# Patient Record
Sex: Male | Born: 1995 | Race: White | Hispanic: No | Marital: Single | Smoking: Current every day smoker
Health system: Southern US, Community
[De-identification: ages and names within clinical notes are randomized; demographics above are authoritative.]

## PROBLEM LIST (undated history)

## (undated) DIAGNOSIS — L858 Other specified epidermal thickening: Secondary | ICD-10-CM

## (undated) DIAGNOSIS — J40 Bronchitis, not specified as acute or chronic: Secondary | ICD-10-CM

## (undated) DIAGNOSIS — L209 Atopic dermatitis, unspecified: Secondary | ICD-10-CM

## (undated) DIAGNOSIS — T7840XA Allergy, unspecified, initial encounter: Secondary | ICD-10-CM

## (undated) HISTORY — DX: Other specified epidermal thickening: L85.8

## (undated) HISTORY — PX: OTHER SURGICAL HISTORY: SHX169

## (undated) HISTORY — PX: WISDOM TOOTH EXTRACTION: SHX21

## (undated) HISTORY — DX: Atopic dermatitis, unspecified: L20.9

## (undated) HISTORY — DX: Allergy, unspecified, initial encounter: T78.40XA

## (undated) HISTORY — PX: CIRCUMCISION: SUR203

## (undated) HISTORY — PX: DENTAL SURGERY: SHX609

---
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
## 2013-02-26 ENCOUNTER — Ambulatory Visit (INDEPENDENT_AMBULATORY_CARE_PROVIDER_SITE_OTHER): Payer: 59 | Admitting: Family Medicine

## 2013-02-26 ENCOUNTER — Encounter: Payer: Self-pay | Admitting: Family Medicine

## 2013-02-26 VITALS — BP 121/71 | HR 69 | Ht 75.0 in | Wt 169.0 lb

## 2013-02-26 DIAGNOSIS — K625 Hemorrhage of anus and rectum: Secondary | ICD-10-CM

## 2013-02-26 NOTE — Progress Notes (Signed)
CC: Shannon Barber is a 17 y.o. male is here for Establish Care and has Dietary question   Subjective: HPI:  Patient presents to reestablish after not being seen for 3 years by our clinic  Patient complains of rectal pain and bleeding that occurred 1-1/2 years ago. He reports never eating meat until one day eating at [employer] later that night he had bright red blood from his rectum with bowel movements that was described as moderate severity that quickly became mild and slowly improved over the next 3-5 days. He was accompanied by mild rectal irritation not further described. Since then he denies any bleeding tar-like stool, rectal pain, constipation, diarrhea, pelvic pain, unintentional weight loss. Nor abdominal pain. He denies family history of colon cancer, inflammatory bowel disease, or rectal bleeding. He is about to join the service and was to make sure he can eat red meat. He is able to eat fish and seafood without difficulty   Review of Systems - General ROS: negative for - chills, fever, night sweats, weight gain or weight loss Ophthalmic ROS: negative for - decreased vision Psychological ROS: negative for - anxiety or depression ENT ROS: negative for - hearing change, nasal congestion, tinnitus or allergies Hematological and Lymphatic ROS: negative for - bleeding problems, bruising or swollen lymph nodes Breast ROS: negative Respiratory ROS: no cough, shortness of breath, or wheezing Cardiovascular ROS: no chest pain or dyspnea on exertion Gastrointestinal ROS: no abdominal pain, change in bowel habits Genito-Urinary ROS: negative for - genital discharge, genital ulcers, incontinence or abnormal bleeding from genitals Musculoskeletal ROS: negative for - joint pain or muscle pain Neurological ROS: negative for - headaches or memory loss Dermatological ROS: negative for lumps, mole changes, rash and skin lesion changes  Past Medical History  Diagnosis Date  . Allergy    allergic rhinitis   . Atopic dermatitis   . Keratosis pilaris 2006    normal     Family History  Problem Relation Age of Onset  . Allergies Father     foods, atopic dermatitis  . Hypertension Maternal Grandmother   . Diabetes Maternal Grandfather   . Heart disease Maternal Grandfather   . Cancer Maternal Grandfather   . Heart disease Other     CVA  . Allergies       History  Substance Use Topics  . Smoking status: Light Tobacco Smoker  . Smokeless tobacco: Not on file  . Alcohol Use: No     Objective: Filed Vitals:   02/26/13 1330  BP: 121/71  Pulse: 69    Vital signs reviewed. General: Alert and Oriented, No Acute Distress HEENT: Pupils equal, round, reactive to light. Conjunctivae clear.  External ears unremarkable.  Moist mucous membranes. Lungs: Clear and comfortable work of breathing, speaking in full sentences without accessory muscle use. Cardiac: Regular rate and rhythm.  Neuro: CN II-XII grossly intact, gait normal. Extremities: No peripheral edema.  Strong peripheral pulses.  Mental Status: No depression, anxiety, nor agitation. Logical though process. Skin: Warm and dry. Abdomen: Normal bowel sounds, soft and non tender without palpable masses  Assessment & Plan: Shannon Barber was seen today for establish care and has dietary question.  Diagnoses and associated orders for this visit:  Rectal bleeding    Discussed with patient I think is probably safe for him to restart eating red meat however we will do a controlled challenge with 3 stool cards, one prior to eating a small load of red meat, one the morning after  and again to mornings after. If at all positive on either card we'll refer for colonoscopy.  Return if symptoms worsen or fail to improve.

## 2013-02-26 NOTE — Patient Instructions (Addendum)
Ground Beef Challenge - One unseasoned meatball cooked to 160F.  -First stool card: Stool from day before eating meat  -Second stool card: Stool from morning after eating meat.  -Third stool card: Stool from second morning after eating meat.

## 2013-03-12 ENCOUNTER — Telehealth: Payer: Self-pay | Admitting: Family Medicine

## 2013-03-12 NOTE — Telephone Encounter (Signed)
Note found on my desk requesting I call Garo at [phone]. Left message.  Sue Lush,  Will you please try calling Ingvald either today or tomorrow to see if he has any specific questions.

## 2013-03-13 LAB — POC HEMOCCULT BLD/STL (HOME/3-CARD/SCREEN)
Card #1 Date: NEGATIVE
Card #3 Date: NEGATIVE

## 2013-03-13 LAB — CHG BLOOD OCCULT,BY PEROXID,FECES,SINGLE, COLORECTAL SCREEN: .: NEGATIVE

## 2013-03-13 NOTE — Telephone Encounter (Signed)
Left complete message on vm with below results and advise

## 2013-03-13 NOTE — Telephone Encounter (Signed)
Sue Lush, Will you please let Lennix know that his stool cards were negative for blood.  I don't see any evidence of him having an allergy or intolerance to beef if he consumed beef prior to collecting the stool cards.

## 2013-03-13 NOTE — Addendum Note (Signed)
Addended by: Wyline Beady on: 03/13/2013 04:50 PM   Modules accepted: Orders

## 2013-03-13 NOTE — Addendum Note (Signed)
Addended by: Wyline Beady on: 03/13/2013 05:03 PM   Modules accepted: Orders

## 2013-11-01 ENCOUNTER — Ambulatory Visit (INDEPENDENT_AMBULATORY_CARE_PROVIDER_SITE_OTHER): Payer: 59 | Admitting: Family Medicine

## 2013-11-01 ENCOUNTER — Encounter: Payer: Self-pay | Admitting: Family Medicine

## 2013-11-01 VITALS — BP 127/84 | HR 90 | Temp 97.8°F | Wt 169.0 lb

## 2013-11-01 DIAGNOSIS — M25519 Pain in unspecified shoulder: Secondary | ICD-10-CM

## 2013-11-01 DIAGNOSIS — M25511 Pain in right shoulder: Secondary | ICD-10-CM

## 2013-11-01 NOTE — Progress Notes (Signed)
CC: Shannon Barber is a 18 y.o. male is here for right shoulder pain   Subjective: HPI:  Complains of right shoulder pain that has been present for the past 2-3 days that was not preceded by any particular event. 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 At that time pain was mild however progressively worsened to a moderate degree of the course of days. Unfortunately he was discharged from the [employer] because of this injury he believes he was formally diagnosed with an a.c. Sprain. Interventions in the past included x-rays which to his understanding were unremarkable. He has tried Aleve without any benefit. Pain is hard to localize but he estimates that it is in the posterior right shoulder nonradiating. It is worse when trying to lift heavy objects or when rolling over right shoulder nothing else particularly makes it better or worse. He denies motor or sensory disturbances in the right upper extremity nor neck pain. Denies overlying skin changes, cough, shortness of breath, nor chest pain   Review Of Systems Outlined In HPI  Past Medical History  Diagnosis Date  . Allergy     allergic rhinitis   . Atopic dermatitis   . Keratosis pilaris 2006    normal    Past Surgical History  Procedure Laterality Date  . Circumcision     Family History  Problem Relation Age of Onset  . Allergies Father     foods, atopic dermatitis  . Hypertension Maternal Grandmother   . Diabetes Maternal Grandfather   . Heart disease Maternal Grandfather   . Cancer Maternal Grandfather   . Heart disease Other     CVA  . Allergies      History   Social History  . Marital Status: Single    Spouse Name: N/A    Number of Children: N/A  . Years of Education: N/A   Occupational History  . Not on file.   Social History Main Topics  . Smoking status: Light Tobacco Smoker  . Smokeless tobacco: Not on  file  . Alcohol Use: No  . Drug Use: No  . Sexual Activity: Not Currently     Comment: lives with parents, home schooled, enjoys baseball, planes, wants to be a pilot, no smoking in home, no pets, follows lactose-free diet, vegetarian diet, takes B 12 daily.   Other Topics Concern  . Not on file   Social History Narrative  . No narrative on file     Objective: BP 127/84  Pulse 90  Temp(Src) 97.8 F (36.6 C) (Oral)  Wt 169 lb (76.658 kg)  General: Alert and Oriented, No Acute Distress HEENT: Pupils equal, round, reactive to light. Conjunctivae clear.  Moist mucous membranes pharynx unremarkable Lungs: Clear and comfortable work of breathing Cardiac: Regular rate and rhythm.  Right shoulder exam reveals full range of motion and strength in all planes of motion and with individual rotator cuff testing. No overlying redness warmth or swelling.  Neer's test negative.  Hawkins test positive however pain localized to posterior shoulder. Empty can negative. Crossarm test negative. O'Brien's test negative. Apprehension test negative. Speed's test negative. With his shirt off while pushing off the wall there is no evidence of scapular winging. When abducting his right arm 180 in a arc the right scapula appears to externally rotate dramatically greater than compared to the left and there is crepitus overlying the  rotating scapula  Mental Status: No depression, anxiety, nor agitation. Skin: Warm and dry.  Assessment & Plan: Shannon Barber was seen today for right shoulder pain.  Diagnoses and associated orders for this visit:  Right shoulder pain    Discussed with patient low suspicion for rotator cuff or intra-articular pathology/source of pain. 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 Joint dysfunction based on his diagnosis from the [employer]. I've encouraged him to consider  home exercise rehabilitation for scapular stabilization versus the  addition of formal physical therapy which would certainly help speed recovery period. he needs to Discuss this with his mother since she is in charge of insurance.  Return if symptoms worsen or fail to improve.

## 2014-01-29 ENCOUNTER — Telehealth: Payer: Self-pay | Admitting: *Deleted

## 2014-01-29 DIAGNOSIS — M25511 Pain in right shoulder: Secondary | ICD-10-CM

## 2014-01-29 NOTE — Telephone Encounter (Signed)
Referral has been placed. 

## 2014-01-29 NOTE — Telephone Encounter (Signed)
Pt notified via vm

## 2014-01-29 NOTE — Telephone Encounter (Signed)
Pt would like to do PT for his shoulder.

## 2014-02-05 ENCOUNTER — Telehealth: Payer: Self-pay | Admitting: *Deleted

## 2014-02-05 NOTE — Telephone Encounter (Signed)
Pt's mother called and left a vm on my phone upset that pt has not been scheduled for PT yet. She also states on the vm not to give information to her husband who works third shift and doesn't have the time or availability to relay inforamtion. Since this pt is over 16 I didn't call the mother back. I called the number in the patient's chart listed as the mobile # and left a vm stating that a referral had been place on 6/2. Some one from PT actually had left a vm for the pt to call their office to schedule with them. On the message I left a left the number [phone]) so pt could call to schedule with them

## 2014-02-14 ENCOUNTER — Ambulatory Visit: Payer: 59 | Admitting: Physical Therapy

## 2014-02-22 ENCOUNTER — Ambulatory Visit (INDEPENDENT_AMBULATORY_CARE_PROVIDER_SITE_OTHER): Payer: 59 | Admitting: Physical Therapy

## 2014-02-22 DIAGNOSIS — M6281 Muscle weakness (generalized): Secondary | ICD-10-CM

## 2014-02-22 DIAGNOSIS — R609 Edema, unspecified: Secondary | ICD-10-CM

## 2014-02-22 DIAGNOSIS — M25619 Stiffness of unspecified shoulder, not elsewhere classified: Secondary | ICD-10-CM

## 2014-02-25 ENCOUNTER — Encounter: Payer: 59 | Admitting: Physical Therapy

## 2014-03-05 ENCOUNTER — Encounter: Payer: 59 | Admitting: Physical Therapy

## 2014-03-07 ENCOUNTER — Encounter (INDEPENDENT_AMBULATORY_CARE_PROVIDER_SITE_OTHER): Payer: 59 | Admitting: Physical Therapy

## 2014-03-07 DIAGNOSIS — M25619 Stiffness of unspecified shoulder, not elsewhere classified: Secondary | ICD-10-CM

## 2014-03-07 DIAGNOSIS — R609 Edema, unspecified: Secondary | ICD-10-CM

## 2014-03-07 DIAGNOSIS — M25519 Pain in unspecified shoulder: Secondary | ICD-10-CM

## 2014-03-07 DIAGNOSIS — M6281 Muscle weakness (generalized): Secondary | ICD-10-CM

## 2014-03-14 ENCOUNTER — Encounter: Payer: 59 | Admitting: Physical Therapy

## 2014-04-07 ENCOUNTER — Emergency Department
Admission: EM | Admit: 2014-04-07 | Discharge: 2014-04-07 | Disposition: A | Discharge status: Home / Self Care | Payer: 59 | Source: Home / Self Care | Attending: Emergency Medicine | Admitting: Emergency Medicine

## 2014-04-07 ENCOUNTER — Encounter: Payer: Self-pay | Admitting: Emergency Medicine

## 2014-04-07 DIAGNOSIS — J020 Streptococcal pharyngitis: Secondary | ICD-10-CM

## 2014-04-07 DIAGNOSIS — J0300 Acute streptococcal tonsillitis, unspecified: Secondary | ICD-10-CM

## 2014-04-07 LAB — POCT RAPID STREP A (OFFICE): Rapid Strep A Screen: POSITIVE — AB

## 2014-04-07 MED ORDER — PENICILLIN G BENZATHINE 1200000 UNIT/2ML IM SUSP
1.2000 10*6.[IU] | Freq: Once | INTRAMUSCULAR | Status: AC
  Administered 2014-04-07: 1.2 10*6.[IU] via INTRAMUSCULAR

## 2014-04-07 MED ORDER — AMOXICILLIN 875 MG PO TABS: ORAL_TABLET | ORAL | Status: DC

## 2014-04-07 NOTE — ED Provider Notes (Addendum)
CSN: [id number]     Arrival date & time 04/07/14  1105 History   First MD Initiated Contact with Patient 04/07/14 1117     Chief Complaint  Patient presents with  . Sore Throat    HPI SORE THROAT Onset:3 days    Severity: Severe Tried OTC meds without significant relief.  Symptoms:  + Fever , chills and sweats + Swollen neck glands No Recent Strep Exposure     No Myalgias No Headache No Rash  No Discolored Nasal Mucus No Allergy symptoms No sinus pain/pressure No itchy/red eyes No earache  No Drooling No Trismus  Mild Nausea, with decreased appetite. Tolerating small amount of clear liquids. No Vomiting No Abdominal pain No Diarrhea No Reflux symptoms  Mild Cough No Breathing Difficulty No Shortness of Breath No pleuritic pain No Wheezing No Hemoptysis   Past Medical History  Diagnosis Date  . Allergy     allergic rhinitis   . Atopic dermatitis   . Keratosis pilaris 2006    normal   Past Surgical History  Procedure Laterality Date  . Circumcision    . Wisdom tooth extraction    . Dental surgery     Family History  Problem Relation Age of Onset  . Allergies Father     foods, atopic dermatitis  . Hypertension Maternal Grandmother   . Diabetes Maternal Grandfather   . Heart disease Maternal Grandfather   . Cancer Maternal Grandfather   . Heart disease Other     CVA  . Allergies     History  Substance Use Topics  . Smoking status: Light Tobacco Smoker  . Smokeless tobacco: Never Used  . Alcohol Use: No    Review of Systems  All other systems reviewed and are negative.   Allergies  Review of patient's allergies indicates no known allergies.  Home Medications   Prior to Admission medications   Medication Sig Start Date End Date Taking? Authorizing Provider  amoxicillin (AMOXIL) 875 MG tablet Take 1 twice a day X 10 days. 04/07/14   Lajean Manesavid Massey, MD   BP 108/69  Pulse 87  Temp(Src) 98.2 F (36.8 C) (Oral)  Ht 6\' 3"  (1.905 m)  Wt  157 lb 4 oz (71.328 kg)  BMI 19.65 kg/m2  SpO2 97% Physical Exam  Nursing note and vitals reviewed. Constitutional: He is oriented to person, place, and time. He appears well-developed and well-nourished.  Non-toxic appearance. He appears ill. No distress.  HENT:  Head: Normocephalic and atraumatic.  Right Ear: Tympanic membrane, external ear and ear canal normal.  Left Ear: Tympanic membrane, external ear and ear canal normal.  Nose: Nose normal. Right sinus exhibits no maxillary sinus tenderness and no frontal sinus tenderness. Left sinus exhibits no maxillary sinus tenderness and no frontal sinus tenderness.  Mouth/Throat: Uvula is midline and mucous membranes are normal. No oral lesions. Posterior oropharyngeal erythema present. No oropharyngeal exudate or tonsillar abscesses.  2+ bilateral Tonsillar enlargement. Beefy-red posterior pharynx and tonsils without exudate  Airway intact.  Eyes: Conjunctivae are normal. No scleral icterus.  Neck: Neck supple.  Cardiovascular: Normal rate, regular rhythm and normal heart sounds.   No murmur heard. Pulmonary/Chest: Effort normal and breath sounds normal. No stridor. No respiratory distress. He has no wheezes. He has no rhonchi. He has no rales.  Abdominal: Soft. He exhibits no mass. There is no hepatosplenomegaly. There is no tenderness.  Lymphadenopathy:    He has cervical adenopathy.       Right cervical: Superficial  cervical adenopathy present. No deep cervical and no posterior cervical adenopathy present.      Left cervical: Superficial cervical adenopathy present. No deep cervical and no posterior cervical adenopathy present.  Neurological: He is alert and oriented to person, place, and time.  Skin: Skin is warm. No rash noted.  Psychiatric: He has a normal mood and affect.    ED Course  Procedures (including critical care time) Labs Review Labs Reviewed  POCT RAPID STREP A (OFFICE) - Abnormal; Notable for the following:     Rapid Strep A Screen Positive (*)    All other components within normal limits    Imaging Review No results found.   MDM   1. Acute streptococcal tonsillitis    Rapid strep test positive Treatment options discussed, as well as risks, benefits, alternatives. Patient and mother  voiced understanding and agreement with the following plans: Bicillin LA 1.2 million units IM stat. Amoxicillin 875 twice a day x10 days Hygiene and contagiousness discussed. Push fluids and other symptomatic care. Tylenol or ibuprofen as needed for pain or fever  Follow-up with your primary care doctor in 5-7 days if not improving, or sooner if symptoms become worse. Precautions discussed. Red flags discussed. Questions invited and answered. Patient and Mother voiced understanding and agreement.      Lajean Manes, MD 04/07/14 4540  Lajean Manes, MD 04/07/14 [phone]

## 2014-04-07 NOTE — ED Notes (Signed)
Sore throat began 3 days ago.  Productive cough with thin yellow sputum.   Hard to swallow and talk.

## 2014-04-11 ENCOUNTER — Telehealth: Payer: Self-pay

## 2014-04-11 NOTE — ED Notes (Signed)
Left message with his dad to have him call back to the urgent care.

## 2014-04-13 ENCOUNTER — Telehealth: Payer: Self-pay | Admitting: Emergency Medicine

## 2014-07-09 ENCOUNTER — Encounter: Payer: Self-pay | Admitting: Family Medicine

## 2014-07-09 ENCOUNTER — Other Ambulatory Visit: Payer: Self-pay | Admitting: Family Medicine

## 2014-07-09 ENCOUNTER — Ambulatory Visit (INDEPENDENT_AMBULATORY_CARE_PROVIDER_SITE_OTHER): Payer: 59 | Admitting: Family Medicine

## 2014-07-09 VITALS — BP 110/70 | HR 103 | Temp 98.6°F | Wt 164.0 lb

## 2014-07-09 DIAGNOSIS — R197 Diarrhea, unspecified: Secondary | ICD-10-CM

## 2014-07-09 DIAGNOSIS — Z206 Contact with and (suspected) exposure to human immunodeficiency virus [HIV]: Secondary | ICD-10-CM

## 2014-07-09 NOTE — Progress Notes (Signed)
CC: Shannon Barber is a 18 y.o. male is here for Diarrhea   Subjective: HPI:  Complaint of diarrhea present for the past2-3 weeks on a daily basis. He estimates he is defecating5-10 times a day. He describes it as loose and mostly pure liquid. It is brown but denies melena or any blood in stool. Symptoms have not gotten better or worsens onset and came on abruptly. Interventions have included Imodium right ear however this made symptoms worse. He's had decreased appetite but this began months before diarrhea. Nothing else next better or worse other than above. It does not awaken him at night. Nobody else at home has similar symptoms. He denies recent travel, drinking untreated water, nausea, vomiting, flank pain, nor abdominal pain.  Expresses concern that he had sexual relations with a individual that may have had HIV. He's no longer in contact with this individual. Intercourse occurred back in 2014. He has no specific complaints other than that above that are prompting his concern today about HIV.  He reports blood exposure to an individual that had herpes all he was trying to help a wound. Bleeding. He isconcerned thathe may have been exposed to herpes virus this way.   Review Of Systems Outlined In HPI  Past Medical History  Diagnosis Date  . Allergy     allergic rhinitis   . Atopic dermatitis   . Keratosis pilaris 2006    normal    Past Surgical History  Procedure Laterality Date  . Circumcision    . Wisdom tooth extraction    . Dental surgery     Family History  Problem Relation Age of Onset  . Allergies Father     foods, atopic dermatitis  . Hypertension Maternal Grandmother   . Diabetes Maternal Grandfather   . Heart disease Maternal Grandfather   . Cancer Maternal Grandfather   . Heart disease Other     CVA  . Allergies      History   Social History  . Marital Status: Single    Spouse Name: N/A    Number of Children: N/A  . Years of Education: N/A    Occupational History  . Not on file.   Social History Main Topics  . Smoking status: Light Tobacco Smoker  . Smokeless tobacco: Never Used  . Alcohol Use: No  . Drug Use: No  . Sexual Activity: Not Currently     Comment: lives with parents, home schooled, enjoys baseball, planes, wants to be a pilot, no smoking in home, no pets, follows lactose-free diet, vegetarian diet, takes B 12 daily.   Other Topics Concern  . Not on file   Social History Narrative     Objective: BP 110/70 mmHg  Pulse 103  Temp(Src) 98.6 F (37 C) (Oral)  Wt 164 lb (74.39 kg)  General: Alert and Oriented, No Acute Distress HEENT: Pupils equal, round, reactive to light. Conjunctivae clear.  Moistmucous membranes pharynxunremarkable Lungs: clearing comfortable work of breathing Cardiac: Regular rate and rhythm.  Abdomen: Normal bowel sounds, soft without palpable masses.mild left lower quadrant pain with deep palpation but no rebound or guarding or rigidity. No pain in the other 3 quadrants Extremities: No peripheral edema.  Strong peripheral pulses.  Mental Status: No depression, anxiety, nor agitation. Skin: Warm and dry.  Assessment & Plan: Shannon Barber was seen today for diarrhea.  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 Difficile is a possibility. Checking metabolic panel and white count with differential.ultimate plan will be based on the above results Reassured that he cannot contract herpes from his uncut hands being exposed to the blood of someone that may have had the herpes virus.   Return if symptoms worsen or fail to improve.

## 2014-07-10 ENCOUNTER — Telehealth: Payer: Self-pay | Admitting: Family Medicine

## 2014-07-10 LAB — HIV ANTIBODY (ROUTINE TESTING W REFLEX): HIV 1&2 Ab, 4th Generation: NONREACTIVE

## 2014-07-10 LAB — COMPLETE METABOLIC PANEL WITH GFR
ALT: 9 U/L (ref 0–53)
AST: 15 U/L (ref 0–37)
Albumin: 4.6 g/dL (ref 3.5–5.2)
Alkaline Phosphatase: 69 U/L (ref 39–117)
BILIRUBIN TOTAL: 0.6 mg/dL (ref 0.2–1.2)
BUN: 11 mg/dL (ref 6–23)
CO2: 32 meq/L (ref 19–32)
Calcium: 9.7 mg/dL (ref 8.4–10.5)
Chloride: 99 mEq/L (ref 96–112)
Creat: 0.84 mg/dL (ref 0.50–1.35)
GFR, Est African American: >89 mL/min
GLUCOSE: 80 mg/dL (ref 70–99)
Potassium: 3.8 mEq/L (ref 3.5–5.3)
SODIUM: 139 meq/L (ref 135–145)
Total Protein: 7.4 g/dL (ref 6.0–8.3)

## 2014-07-10 LAB — CBC WITH DIFFERENTIAL/PLATELET
BASOS PCT: 0 % (ref 0–1)
Basophils Absolute: 0 10*3/uL (ref 0.0–0.1)
EOS ABS: 0.5 10*3/uL (ref 0.0–0.7)
EOS PCT: 4 % (ref 0–5)
HEMATOCRIT: 46.1 % (ref 39.0–52.0)
Hemoglobin: 16.2 g/dL (ref 13.0–17.0)
LYMPHS ABS: 2.3 10*3/uL (ref 0.7–4.0)
Lymphocytes Relative: 19 % (ref 12–46)
MCH: 30.7 pg (ref 26.0–34.0)
MCHC: 35.1 g/dL (ref 30.0–36.0)
MCV: 87.5 fL (ref 78.0–100.0)
MONO ABS: 1.2 10*3/uL — AB (ref 0.1–1.0)
MONOS PCT: 10 % (ref 3–12)
NEUTROS PCT: 67 % (ref 43–77)
Neutro Abs: 8.1 10*3/uL — ABNORMAL HIGH (ref 1.7–7.7)
Platelets: 209 10*3/uL (ref 150–400)
RBC: 5.27 MIL/uL (ref 4.22–5.81)
RDW: 13.4 % (ref 11.5–15.5)
WBC: 12.1 10*3/uL — ABNORMAL HIGH (ref 4.0–10.5)

## 2014-07-10 NOTE — Telephone Encounter (Signed)
Patient called back and I relayed your message regarding HIV test being negative.  Also he is planning to do stool cards and bring them back this week or first of next week.  I just wanted to make a note for your reference.

## 2014-07-10 NOTE — Telephone Encounter (Signed)
Noted, i'm assuming this is in reference to a stool sample and not stool cards since no one in the back office gave him any of these or intended to give him these at his last visit

## 2014-07-12 ENCOUNTER — Emergency Department (HOSPITAL_BASED_OUTPATIENT_CLINIC_OR_DEPARTMENT_OTHER): Payer: 59

## 2014-07-12 ENCOUNTER — Emergency Department (HOSPITAL_BASED_OUTPATIENT_CLINIC_OR_DEPARTMENT_OTHER)
Admission: EM | Admit: 2014-07-12 | Discharge: 2014-07-12 | Disposition: A | Discharge status: Home / Self Care | Payer: 59 | Attending: Emergency Medicine | Admitting: Emergency Medicine

## 2014-07-12 ENCOUNTER — Encounter (HOSPITAL_BASED_OUTPATIENT_CLINIC_OR_DEPARTMENT_OTHER): Payer: Self-pay | Admitting: Emergency Medicine

## 2014-07-12 DIAGNOSIS — Y9389 Activity, other specified: Secondary | ICD-10-CM | POA: Diagnosis not present

## 2014-07-12 DIAGNOSIS — Z872 Personal history of diseases of the skin and subcutaneous tissue: Secondary | ICD-10-CM | POA: Diagnosis not present

## 2014-07-12 DIAGNOSIS — Y998 Other external cause status: Secondary | ICD-10-CM | POA: Diagnosis not present

## 2014-07-12 DIAGNOSIS — S6991XA Unspecified injury of right wrist, hand and finger(s), initial encounter: Secondary | ICD-10-CM | POA: Diagnosis present

## 2014-07-12 DIAGNOSIS — W228XXA Striking against or struck by other objects, initial encounter: Secondary | ICD-10-CM | POA: Insufficient documentation

## 2014-07-12 DIAGNOSIS — S62326A Displaced fracture of shaft of fifth metacarpal bone, right hand, initial encounter for closed fracture: Secondary | ICD-10-CM | POA: Insufficient documentation

## 2014-07-12 DIAGNOSIS — Z720 Tobacco use: Secondary | ICD-10-CM | POA: Diagnosis not present

## 2014-07-12 DIAGNOSIS — S62308A Unspecified fracture of other metacarpal bone, initial encounter for closed fracture: Secondary | ICD-10-CM

## 2014-07-12 DIAGNOSIS — Y929 Unspecified place or not applicable: Secondary | ICD-10-CM | POA: Diagnosis not present

## 2014-07-12 DIAGNOSIS — R52 Pain, unspecified: Secondary | ICD-10-CM

## 2014-07-12 MED ORDER — OXYCODONE-ACETAMINOPHEN 5-325 MG PO TABS
1.0000 | ORAL_TABLET | Freq: Once | ORAL | Status: DC
  Filled 2014-07-12: qty 1

## 2014-07-12 MED ORDER — KETOROLAC TROMETHAMINE 60 MG/2ML IM SOLN
60.0000 mg | Freq: Once | INTRAMUSCULAR | Status: AC
  Administered 2014-07-12: 60 mg via INTRAMUSCULAR
  Filled 2014-07-12: qty 2

## 2014-07-12 MED ORDER — OXYCODONE-ACETAMINOPHEN 5-325 MG PO TABS: 1.0000 | ORAL_TABLET | Freq: Four times a day (QID) | ORAL | Status: DC | PRN

## 2014-07-12 MED ORDER — IBUPROFEN 800 MG PO TABS: 800.0000 mg | ORAL_TABLET | Freq: Three times a day (TID) | ORAL | Status: DC

## 2014-07-12 NOTE — ED Provider Notes (Signed)
CSN: [id number]     Arrival date & time 07/12/14  [id number] History   First MD Initiated Contact with Patient 07/12/14 0407     Chief Complaint  Patient presents with  . Hand Injury     (Consider location/radiation/quality/duration/timing/severity/associated sxs/prior Treatment) Patient is a 18 y.o. male presenting with hand injury. The history is provided by the patient.  Hand Injury Location:  Hand Injury: yes   Mechanism of injury comment:  Punched wooden fence Hand location:  R hand Pain details:    Quality:  Aching   Radiates to:  Does not radiate   Severity:  Severe   Onset quality:  Sudden   Timing:  Constant   Progression:  Unchanged Chronicity:  New Dislocation: no   Foreign body present:  No foreign bodies Relieved by:  Nothing Worsened by:  Nothing tried Ineffective treatments:  None tried Associated symptoms: no back pain   Risk factors: no concern for non-accidental trauma     Past Medical History  Diagnosis Date  . Allergy     allergic rhinitis   . Atopic dermatitis   . Keratosis pilaris 2006    normal   Past Surgical History  Procedure Laterality Date  . Circumcision    . Wisdom tooth extraction    . Dental surgery     Family History  Problem Relation Age of Onset  . Allergies Father     foods, atopic dermatitis  . Hypertension Maternal Grandmother   . Diabetes Maternal Grandfather   . Heart disease Maternal Grandfather   . Cancer Maternal Grandfather   . Heart disease Other     CVA  . Allergies     History  Substance Use Topics  . Smoking status: Light Tobacco Smoker  . Smokeless tobacco: Never Used  . Alcohol Use: No    Review of Systems  Musculoskeletal: Negative for back pain.  All other systems reviewed and are negative.     Allergies  Review of patient's allergies indicates no known allergies.  Home Medications   Prior to Admission medications   Not on File   BP 116/91 mmHg  Pulse 120  Temp(Src) 98.3 F (36.8 C)  (Oral)  Resp 18  Ht 6\' 3"  (1.905 m)  Wt 150 lb (68.04 kg)  BMI 18.75 kg/m2  SpO2 98% Physical Exam  Constitutional: He is oriented to person, place, and time. He appears well-developed and well-nourished. No distress.  HENT:  Head: Normocephalic and atraumatic.  Eyes: Conjunctivae and EOM are normal.  Neck: Normal range of motion. Neck supple.  Cardiovascular: Normal rate, regular rhythm and intact distal pulses.   Pulmonary/Chest: Effort normal and breath sounds normal. He has no wheezes.  Abdominal: Soft. Bowel sounds are normal. There is no tenderness.  Musculoskeletal: Normal range of motion.       Right wrist: Normal. He exhibits normal range of motion, no tenderness, no bony tenderness, no swelling, no effusion, no crepitus, no deformity and no laceration.       Right hand: He exhibits deformity and swelling. He exhibits normal capillary refill and no laceration. Normal sensation noted. Normal strength noted.       Hands: Neurological: He is alert and oriented to person, place, and time.  Skin: Skin is warm and dry.  Psychiatric: He has a normal mood and affect.    ED Course  Procedures (including critical care time) Labs Review Labs Reviewed - No data to display  Imaging Review Dg Hand Complete Right  07/12/2014  CLINICAL DATA:  The patient punched a fence tonight. Right hand pain. Initial encounter.  EXAM: RIGHT HAND - COMPLETE 3+ VIEW  COMPARISON:  None.  FINDINGS: There is a fracture of the distal diaphysis of the fifth metacarpal with mild radial and volar angulation. No other bony or joint abnormality is identified.  IMPRESSION: Acute distal fifth metacarpal fracture.   Electronically Signed   By: Drusilla Kannerhomas  Dalessio M.D.   On: 07/12/2014 04:08     EKG Interpretation None      MDM   Final diagnoses:  Pain    Splint, ice elevation and follow up with hand surgery.  Patient refuses percocet as wants to drive to school at 8 am.  Will give RX.  No driving on  narcotics.  Patient verbalizes understanding and agrees to follow up    Kevis Qu Smitty CordsK Ronav Furney-Rasch, MD 07/12/14 0430

## 2014-07-12 NOTE — ED Notes (Signed)
Patient transported to X-ray 

## 2014-07-12 NOTE — ED Notes (Signed)
Hit fence w rt hand  Increased swelling and pain

## 2014-07-12 NOTE — ED Notes (Signed)
Pt punched [place]od[place] fence with hand about an hour ago

## 2014-07-16 ENCOUNTER — Other Ambulatory Visit: Payer: Self-pay | Admitting: Orthopedic Surgery

## 2014-07-17 ENCOUNTER — Encounter (HOSPITAL_BASED_OUTPATIENT_CLINIC_OR_DEPARTMENT_OTHER): Payer: Self-pay | Admitting: *Deleted

## 2014-07-17 ENCOUNTER — Telehealth: Payer: Self-pay | Admitting: Family Medicine

## 2014-07-17 LAB — C. DIFFICILE GDH AND TOXIN A/B
C. DIFFICILE GDH: NOT DETECTED
C. difficile Toxin A/B: NOT DETECTED

## 2014-07-17 MED ORDER — DIPHENOXYLATE-ATROPINE 2.5-0.025 MG PO TABS: 1.0000 | ORAL_TABLET | Freq: Four times a day (QID) | ORAL | Status: DC | PRN

## 2014-07-17 NOTE — Telephone Encounter (Signed)
Left message on vm with results  

## 2014-07-17 NOTE — Telephone Encounter (Signed)
Sue Lushndrea, Will you please let patient know that his stool sample did not show any signs of c.diff causing his diarrhea.  If he's still experiencing loose stools I've printed off an Rx of lomotil that he can use on an as needed basis. (in you inbox)

## 2014-07-22 ENCOUNTER — Encounter (HOSPITAL_BASED_OUTPATIENT_CLINIC_OR_DEPARTMENT_OTHER): Payer: Self-pay

## 2014-07-22 ENCOUNTER — Encounter (HOSPITAL_BASED_OUTPATIENT_CLINIC_OR_DEPARTMENT_OTHER)
Admission: RE | Disposition: A | Discharge status: Home / Self Care | Payer: Self-pay | Source: Ambulatory Visit | Attending: Orthopedic Surgery

## 2014-07-22 ENCOUNTER — Ambulatory Visit (HOSPITAL_BASED_OUTPATIENT_CLINIC_OR_DEPARTMENT_OTHER): Payer: 59 | Admitting: Anesthesiology

## 2014-07-22 ENCOUNTER — Ambulatory Visit (HOSPITAL_BASED_OUTPATIENT_CLINIC_OR_DEPARTMENT_OTHER)
Admission: RE | Admit: 2014-07-22 | Discharge: 2014-07-22 | Disposition: A | Discharge status: Home / Self Care | Payer: 59 | Source: Ambulatory Visit | Attending: Orthopedic Surgery | Admitting: Orthopedic Surgery

## 2014-07-22 DIAGNOSIS — S62326A Displaced fracture of shaft of fifth metacarpal bone, right hand, initial encounter for closed fracture: Secondary | ICD-10-CM | POA: Insufficient documentation

## 2014-07-22 DIAGNOSIS — M79641 Pain in right hand: Secondary | ICD-10-CM | POA: Diagnosis present

## 2014-07-22 DIAGNOSIS — F17210 Nicotine dependence, cigarettes, uncomplicated: Secondary | ICD-10-CM | POA: Insufficient documentation

## 2014-07-22 DIAGNOSIS — Y929 Unspecified place or not applicable: Secondary | ICD-10-CM | POA: Insufficient documentation

## 2014-07-22 DIAGNOSIS — X58XXXA Exposure to other specified factors, initial encounter: Secondary | ICD-10-CM | POA: Diagnosis not present

## 2014-07-22 DIAGNOSIS — K529 Noninfective gastroenteritis and colitis, unspecified: Secondary | ICD-10-CM | POA: Insufficient documentation

## 2014-07-22 DIAGNOSIS — F1210 Cannabis abuse, uncomplicated: Secondary | ICD-10-CM | POA: Diagnosis not present

## 2014-07-22 HISTORY — PX: OPEN REDUCTION INTERNAL FIXATION (ORIF) METACARPAL: SHX6234

## 2014-07-22 LAB — POCT HEMOGLOBIN-HEMACUE: Hemoglobin: 15.4 g/dL (ref 13.0–17.0)

## 2014-07-22 SURGERY — OPEN REDUCTION INTERNAL FIXATION (ORIF) METACARPAL
Anesthesia: General | Site: Finger | Laterality: Right

## 2014-07-22 MED ORDER — LIDOCAINE HCL (PF) 1 % IJ SOLN
INTRAMUSCULAR | Status: AC
  Filled 2014-07-22: qty 60

## 2014-07-22 MED ORDER — FENTANYL CITRATE 0.05 MG/ML IJ SOLN: 50.0000 ug | INTRAMUSCULAR | Status: DC | PRN

## 2014-07-22 MED ORDER — HYDROMORPHONE HCL 1 MG/ML IJ SOLN
0.2500 mg | INTRAMUSCULAR | Status: DC | PRN
  Administered 2014-07-22 (×2): 0.5 mg via INTRAVENOUS

## 2014-07-22 MED ORDER — MIDAZOLAM HCL 2 MG/2ML IJ SOLN
INTRAMUSCULAR | Status: AC
  Filled 2014-07-22: qty 2

## 2014-07-22 MED ORDER — FENTANYL CITRATE 0.05 MG/ML IJ SOLN
INTRAMUSCULAR | Status: DC | PRN
  Administered 2014-07-22 (×2): 50 ug via INTRAVENOUS

## 2014-07-22 MED ORDER — PROPOFOL 10 MG/ML IV BOLUS
INTRAVENOUS | Status: AC
  Filled 2014-07-22: qty 80

## 2014-07-22 MED ORDER — FENTANYL CITRATE 0.05 MG/ML IJ SOLN
INTRAMUSCULAR | Status: AC
  Filled 2014-07-22: qty 6

## 2014-07-22 MED ORDER — GLYCOPYRROLATE 0.2 MG/ML IJ SOLN
INTRAMUSCULAR | Status: DC | PRN
  Administered 2014-07-22: 0.2 mg via INTRAVENOUS

## 2014-07-22 MED ORDER — LACTATED RINGERS IV SOLN
INTRAVENOUS | Status: DC
  Administered 2014-07-22 (×2): via INTRAVENOUS

## 2014-07-22 MED ORDER — OXYCODONE-ACETAMINOPHEN 5-325 MG PO TABS: 1.0000 | ORAL_TABLET | ORAL | Status: DC | PRN

## 2014-07-22 MED ORDER — MIDAZOLAM HCL 2 MG/2ML IJ SOLN: 1.0000 mg | INTRAMUSCULAR | Status: DC | PRN

## 2014-07-22 MED ORDER — PROPOFOL 10 MG/ML IV BOLUS
INTRAVENOUS | Status: DC | PRN
  Administered 2014-07-22: 100 mg via INTRAVENOUS
  Administered 2014-07-22: 200 mg via INTRAVENOUS

## 2014-07-22 MED ORDER — ONDANSETRON HCL 4 MG/2ML IJ SOLN
INTRAMUSCULAR | Status: DC | PRN
  Administered 2014-07-22: 4 mg via INTRAVENOUS

## 2014-07-22 MED ORDER — PROPOFOL 10 MG/ML IV EMUL
INTRAVENOUS | Status: AC
  Filled 2014-07-22: qty 100

## 2014-07-22 MED ORDER — HYDROMORPHONE HCL 1 MG/ML IJ SOLN
INTRAMUSCULAR | Status: AC
  Filled 2014-07-22: qty 1

## 2014-07-22 MED ORDER — ONDANSETRON HCL 4 MG/2ML IJ SOLN: 4.0000 mg | Freq: Once | INTRAMUSCULAR | Status: DC | PRN

## 2014-07-22 MED ORDER — BUPIVACAINE HCL (PF) 0.25 % IJ SOLN
INTRAMUSCULAR | Status: AC
  Filled 2014-07-22: qty 30

## 2014-07-22 MED ORDER — OXYCODONE HCL 5 MG PO TABS
5.0000 mg | ORAL_TABLET | Freq: Once | ORAL | Status: AC | PRN
  Administered 2014-07-22: 5 mg via ORAL

## 2014-07-22 MED ORDER — OXYCODONE HCL 5 MG/5ML PO SOLN: 5.0000 mg | Freq: Once | ORAL | Status: AC | PRN

## 2014-07-22 MED ORDER — BUPIVACAINE HCL (PF) 0.25 % IJ SOLN
INTRAMUSCULAR | Status: DC | PRN
  Administered 2014-07-22: 10 mL

## 2014-07-22 MED ORDER — OXYCODONE HCL 5 MG PO TABS
ORAL_TABLET | ORAL | Status: AC
  Filled 2014-07-22: qty 1

## 2014-07-22 MED ORDER — MIDAZOLAM HCL 5 MG/5ML IJ SOLN
INTRAMUSCULAR | Status: DC | PRN
  Administered 2014-07-22: 2 mg via INTRAVENOUS

## 2014-07-22 MED ORDER — LIDOCAINE HCL (CARDIAC) 20 MG/ML IV SOLN
INTRAVENOUS | Status: DC | PRN
  Administered 2014-07-22: 50 mg via INTRAVENOUS

## 2014-07-22 MED ORDER — DEXAMETHASONE SODIUM PHOSPHATE 4 MG/ML IJ SOLN
INTRAMUSCULAR | Status: DC | PRN
  Administered 2014-07-22: 10 mg via INTRAVENOUS

## 2014-07-22 MED ORDER — CEFAZOLIN SODIUM-DEXTROSE 2-3 GM-% IV SOLR
INTRAVENOUS | Status: DC | PRN
  Administered 2014-07-22: 2 g via INTRAVENOUS

## 2014-07-22 SURGICAL SUPPLY — 62 items
APL SKNCLS STERI-STRIP NONHPOA (GAUZE/BANDAGES/DRESSINGS) ×1
BANDAGE ELASTIC 3 VELCRO ST LF (GAUZE/BANDAGES/DRESSINGS) ×2 IMPLANT
BANDAGE ELASTIC 4 VELCRO ST LF (GAUZE/BANDAGES/DRESSINGS) ×2 IMPLANT
BENZOIN TINCTURE PRP APPL 2/3 (GAUZE/BANDAGES/DRESSINGS) ×2 IMPLANT
BIT DRILL 1.1 MINI (BIT) ×1 IMPLANT
BLADE SURG 15 STRL LF DISP TIS (BLADE) ×1 IMPLANT
BLADE SURG 15 STRL SS (BLADE) ×2
BNDG CMPR 9X4 STRL LF SNTH (GAUZE/BANDAGES/DRESSINGS) ×1
BNDG CMPR MD 5X2 ELC HKLP STRL (GAUZE/BANDAGES/DRESSINGS)
BNDG ELASTIC 2 VLCR STRL LF (GAUZE/BANDAGES/DRESSINGS) IMPLANT
BNDG ESMARK 4X9 LF (GAUZE/BANDAGES/DRESSINGS) ×2 IMPLANT
BNDG GAUZE ELAST 4 BULKY (GAUZE/BANDAGES/DRESSINGS) ×2 IMPLANT
CANISTER SUCT 1200ML W/VALVE (MISCELLANEOUS) IMPLANT
CORDS BIPOLAR (ELECTRODE) ×2 IMPLANT
COVER BACK TABLE 60X90IN (DRAPES) ×2 IMPLANT
CUFF TOURNIQUET SINGLE 18IN (TOURNIQUET CUFF) ×2 IMPLANT
DECANTER SPIKE VIAL GLASS SM (MISCELLANEOUS) IMPLANT
DRAPE EXTREMITY T 121X128X90 (DRAPE) ×2 IMPLANT
DRAPE OEC MINIVIEW 54X84 (DRAPES) ×2 IMPLANT
DRAPE SURG 17X23 STRL (DRAPES) ×2 IMPLANT
DRILL BIT 1.1 MINI (BIT) ×2
DURAPREP 26ML APPLICATOR (WOUND CARE) ×2 IMPLANT
GAUZE SPONGE 4X4 12PLY STRL (GAUZE/BANDAGES/DRESSINGS) ×2 IMPLANT
GAUZE SPONGE 4X4 16PLY XRAY LF (GAUZE/BANDAGES/DRESSINGS) IMPLANT
GAUZE XEROFORM 1X8 LF (GAUZE/BANDAGES/DRESSINGS) IMPLANT
GLOVE SURG SYN 8.0 (GLOVE) ×2 IMPLANT
GOWN STRL REUS W/ TWL LRG LVL3 (GOWN DISPOSABLE) ×1 IMPLANT
GOWN STRL REUS W/TWL LRG LVL3 (GOWN DISPOSABLE) ×2
GOWN STRL REUS W/TWL XL LVL3 (GOWN DISPOSABLE) ×2 IMPLANT
NEEDLE HYPO 25X1 1.5 SAFETY (NEEDLE) IMPLANT
NS IRRIG 1000ML POUR BTL (IV SOLUTION) ×2 IMPLANT
PACK BASIN DAY SURGERY FS (CUSTOM PROCEDURE TRAY) ×2 IMPLANT
PAD CAST 3X4 CTTN HI CHSV (CAST SUPPLIES) ×1 IMPLANT
PAD CAST 4YDX4 CTTN HI CHSV (CAST SUPPLIES) IMPLANT
PADDING CAST ABS 4INX4YD NS (CAST SUPPLIES) ×1
PADDING CAST ABS COTTON 4X4 ST (CAST SUPPLIES) ×1 IMPLANT
PADDING CAST COTTON 3X4 STRL (CAST SUPPLIES) ×2
PADDING CAST COTTON 4X4 STRL (CAST SUPPLIES)
PADDING UNDERCAST 2 STRL (CAST SUPPLIES) ×1
PADDING UNDERCAST 2X4 STRL (CAST SUPPLIES) ×1 IMPLANT
PLATE STRAIGHT 1.5 6 HOLE (Plate) ×2 IMPLANT
SCREW CORTEX 1.5X10 (Screw) ×2 IMPLANT
SCREW CORTEX 1.5X8 (Screw) ×8 IMPLANT
SHEET MEDIUM DRAPE 40X70 STRL (DRAPES) ×2 IMPLANT
SPLINT PLASTER CAST XFAST 4X15 (CAST SUPPLIES) ×15 IMPLANT
SPLINT PLASTER XTRA FAST SET 4 (CAST SUPPLIES) ×15
STOCKINETTE 4X48 STRL (DRAPES) ×2 IMPLANT
STRIP CLOSURE SKIN 1/2X4 (GAUZE/BANDAGES/DRESSINGS) ×2 IMPLANT
SUCTION FRAZIER TIP 10 FR DISP (SUCTIONS) IMPLANT
SUT ETHILON 4 0 PS 2 18 (SUTURE) IMPLANT
SUT ETHILON 5 0 PS 2 18 (SUTURE) IMPLANT
SUT MERSILENE 4 0 P 3 (SUTURE) IMPLANT
SUT VIC AB 4-0 P-3 18XBRD (SUTURE) ×1 IMPLANT
SUT VIC AB 4-0 P3 18 (SUTURE) ×2
SUT VICRYL 4-0 PS2 18IN ABS (SUTURE) IMPLANT
SUT VICRYL RAPIDE 4-0 (SUTURE) IMPLANT
SUT VICRYL RAPIDE 4/0 PS 2 (SUTURE) IMPLANT
SYR BULB 3OZ (MISCELLANEOUS) ×2 IMPLANT
SYRINGE 10CC LL (SYRINGE) ×2 IMPLANT
TOWEL OR 17X24 6PK STRL BLUE (TOWEL DISPOSABLE) ×2 IMPLANT
TUBE CONNECTING 20X1/4 (TUBING) IMPLANT
UNDERPAD 30X30 INCONTINENT (UNDERPADS AND DIAPERS) ×2 IMPLANT

## 2014-07-22 NOTE — Discharge Instructions (Signed)

## 2014-07-22 NOTE — Transfer of Care (Signed)
Immediate Anesthesia Transfer of Care Note  Patient: Shannon Barber  Procedure(s) Performed: Procedure(s): OPEN REDUCTION INTERNAL FIXATION (ORIF) RIGHT SMALL  METACARPAL FRACTURE  (Right)  Patient Location: PACU  Anesthesia Type:General  Level of Consciousness: awake, alert  and patient cooperative  Airway & Oxygen Therapy: Patient Spontanous Breathing and Patient connected to face mask oxygen  Post-op Assessment: Report given to PACU RN and Post -op Vital signs reviewed and stable  Post vital signs: Reviewed and stable  Complications: No apparent anesthesia complications

## 2014-07-22 NOTE — Anesthesia Postprocedure Evaluation (Signed)
  Anesthesia Post-op Note  Patient: Shannon Barber  Procedure(s) Performed: Procedure(s): OPEN REDUCTION INTERNAL FIXATION (ORIF) RIGHT SMALL  METACARPAL FRACTURE  (Right)  Patient Location: PACU  Anesthesia Type:General  Level of Consciousness: awake, alert  and oriented  Airway and Oxygen Therapy: Patient Spontanous Breathing  Post-op Pain: none  Post-op Assessment: Post-op Vital signs reviewed  Post-op Vital Signs: Reviewed  Last Vitals:  Filed Vitals:   07/22/14 1017  BP: 115/69  Pulse: 65  Temp: 36.7 C  Resp: 18    Complications: No apparent anesthesia complications

## 2014-07-22 NOTE — Op Note (Signed)
See note [id number]

## 2014-07-22 NOTE — Anesthesia Procedure Notes (Signed)
Procedure Name: LMA Insertion Date/Time: 07/22/2014 8:14 AM Performed by: Genevieve NorlanderLINKA, Ida Milbrath L Pre-anesthesia Checklist: Patient identified, Emergency Drugs available, Suction available, Patient being monitored and Timeout performed Patient Re-evaluated:Patient Re-evaluated prior to inductionOxygen Delivery Method: Circle System Utilized Preoxygenation: Pre-oxygenation with 100% oxygen Intubation Type: IV induction Ventilation: Mask ventilation without difficulty LMA: LMA inserted LMA Size: 5.0 Number of attempts: 1 Airway Equipment and Method: bite block Placement Confirmation: positive ETCO2 Tube secured with: Tape Dental Injury: Teeth and Oropharynx as per pre-operative assessment

## 2014-07-22 NOTE — H&P (Signed)
Shannon Barber is an 18 y.o. male.   Chief Complaint: right hand pain and defomity HPI: as above s/p right hand trauma with displaced right small metacarpal fracture  History reviewed. No pertinent past medical history.  Past Surgical History  Procedure Laterality Date  . Oral gum graft surgery    . Wisdom tooth extraction      History reviewed. No pertinent family history. Social History:  reports that he has been smoking Cigarettes.  He has been smoking about 0.25 packs per day. He does not have any smokeless tobacco history on file. He reports that he drinks alcohol. He reports that he uses illicit drugs (Marijuana).  Allergies: No Known Allergies  Medications Prior to Admission  Medication Sig Dispense Refill  . ibuprofen (ADVIL,MOTRIN) 800 MG tablet Take 800 mg by mouth every 8 (eight) hours as needed.      No results found for this or any previous visit (from the past 48 hour(s)). No results found.  Review of Systems  All other systems reviewed and are negative.   Height 6\' 3"  (1.905 m), weight 70.308 kg (155 lb). Physical Exam  Constitutional: He is oriented to person, place, and time. He appears well-developed and well-nourished.  HENT:  Head: Normocephalic and atraumatic.  Cardiovascular: Normal rate.   Respiratory: Effort normal.  Musculoskeletal:       Right hand: He exhibits bony tenderness and deformity.  Displaced right small metacarpal fracture  Neurological: He is alert and oriented to person, place, and time.  Skin: Skin is warm.  Psychiatric: He has a normal mood and affect. His behavior is normal. Judgment and thought content normal.     Assessment/Plan As above   Plan ORIF  Boyd Buffalo A 07/22/2014, 6:15 AM

## 2014-07-22 NOTE — Anesthesia Preprocedure Evaluation (Addendum)
Anesthesia Evaluation  Patient identified by MRN, date of birth, ID band Patient awake    Reviewed: Allergy & Precautions, H&P , NPO status , Patient's Chart, lab work & pertinent test results  Airway Mallampati: I  TM Distance: >3 FB Neck ROM: Full    Dental  (+) Teeth Intact, Dental Advisory Given   Pulmonary Current Smoker,  breath sounds clear to auscultation        Cardiovascular negative cardio ROS  Rhythm:Regular Rate:Normal     Neuro/Psych negative neurological ROS  negative psych ROS   GI/Hepatic negative GI ROS, Neg liver ROS,   Endo/Other  negative endocrine ROS  Renal/GU negative Renal ROS     Musculoskeletal negative musculoskeletal ROS (+)   Abdominal   Peds  Hematology negative hematology ROS (+)   Anesthesia Other Findings Chronic diarrhea  Reproductive/Obstetrics                            Anesthesia Physical Anesthesia Plan  ASA: II  Anesthesia Plan: General   Post-op Pain Management:    Induction: Intravenous  Airway Management Planned: LMA  Additional Equipment:   Intra-op Plan:   Post-operative Plan: Extubation in OR  Informed Consent: I have reviewed the patients History and Physical, chart, labs and discussed the procedure including the risks, benefits and alternatives for the proposed anesthesia with the patient or authorized representative who has indicated his/her understanding and acceptance.   Dental advisory given  Plan Discussed with: CRNA and Surgeon  Anesthesia Plan Comments:        Anesthesia Quick Evaluation

## 2014-07-24 ENCOUNTER — Encounter (HOSPITAL_BASED_OUTPATIENT_CLINIC_OR_DEPARTMENT_OTHER): Payer: Self-pay | Admitting: Orthopedic Surgery

## 2014-07-24 NOTE — Op Note (Signed)
NAME:  Alcide CleverLACKEY, Jerid               ACCOUNT NO.:  [id number]  MEDICAL RECORD NO.:  [id number]  LOCATION:                                 FACILITY:  PHYSICIAN:  Artist PaisMatthew A. Tylasia Fletchall, M.D.DATE OF BIRTH:  [birth year]  DATE OF PROCEDURE:  07/22/2014 DATE OF DISCHARGE:  07/22/2014                              OPERATIVE REPORT   PREOPERATIVE DIAGNOSIS:  Displaced right small metacarpal fracture.  POSTOPERATIVE DIAGNOSIS:  Displaced right small metacarpal fracture.  PROCEDURE:  Open reduction and internal fixation of above using a 6-hole 1.5 mm Synthes modular hand set plate.  SURGEON:  Artist PaisMatthew A. Mina MarbleWeingold, M.D.  ASSISTANT:  None.  ANESTHESIA:  General.  COMPLICATIONS:  No complications.  DRAINS:  No drains.  DESCRIPTION OF PROCEDURE:  The patient was taken to the operating suite. After induction of general anesthetic, right upper extremity was prepped and draped in sterile fashion.  An Esmarch was used to exsanguinate the limb.  Tourniquet was then inflated to 250 mmHg.  At this point, incision made over the small metacarpal along the dorsal ulnar border. Skin was incised sharply.  The EDQ and EDC to the small finger was carefully retracted ulnarly.  A subperiosteal dissection of the fracture was undertaken.  We debrided the fracture of clot, reduction was performed with downward pressure, and side rotation as he had a malrotated fracture.  This was held with reduction clamp.  We then pre contoured a 6 hole 1.5 mm stainless steel 1.5 mm modular handset plate with 3 cortical screws above, 3 cortical screws below the fracture site. This was placed with no undue difficulty.  Intraoperative fluoroscopy revealed adequate reduction in AP, lateral, and oblique view.  The wound was irrigated and loosely closed in layers of 4-0 Vicryl to cover the plate and a 3-0 Prolene subcuticular stitch on the skin.  Steri-Strips, 4 x 4s, fluffs, and compressive dressing was applied.  The  patient tolerated the procedure well in concealed fashion.     Artist PaisMatthew A. Mina MarbleWeingold, M.D.    MAW/MEDQ  D:  07/22/2014  T:  07/22/2014  Job:  [id number]

## 2014-07-28 ENCOUNTER — Encounter (HOSPITAL_COMMUNITY): Payer: Self-pay | Admitting: *Deleted

## 2014-07-28 ENCOUNTER — Emergency Department (HOSPITAL_COMMUNITY)
Admission: EM | Admit: 2014-07-28 | Discharge: 2014-07-28 | Disposition: A | Discharge status: Home / Self Care | Payer: PRIVATE HEALTH INSURANCE | Attending: Emergency Medicine | Admitting: Emergency Medicine

## 2014-07-28 DIAGNOSIS — G8918 Other acute postprocedural pain: Secondary | ICD-10-CM | POA: Diagnosis not present

## 2014-07-28 DIAGNOSIS — Z8709 Personal history of other diseases of the respiratory system: Secondary | ICD-10-CM | POA: Diagnosis not present

## 2014-07-28 DIAGNOSIS — M79641 Pain in right hand: Secondary | ICD-10-CM | POA: Diagnosis not present

## 2014-07-28 DIAGNOSIS — Z8781 Personal history of (healed) traumatic fracture: Secondary | ICD-10-CM | POA: Diagnosis not present

## 2014-07-28 DIAGNOSIS — Z791 Long term (current) use of non-steroidal anti-inflammatories (NSAID): Secondary | ICD-10-CM | POA: Insufficient documentation

## 2014-07-28 DIAGNOSIS — Z9889 Other specified postprocedural states: Secondary | ICD-10-CM | POA: Diagnosis not present

## 2014-07-28 DIAGNOSIS — Z720 Tobacco use: Secondary | ICD-10-CM | POA: Diagnosis not present

## 2014-07-28 DIAGNOSIS — Q829 Congenital malformation of skin, unspecified: Secondary | ICD-10-CM | POA: Diagnosis not present

## 2014-07-28 DIAGNOSIS — Z872 Personal history of diseases of the skin and subcutaneous tissue: Secondary | ICD-10-CM | POA: Diagnosis not present

## 2014-07-28 NOTE — Discharge Instructions (Signed)
Pain Relief Preoperatively and Postoperatively °Being a good patient does not mean being a silent one. If you have questions, problems, or concerns about the pain you may feel after surgery, let your caregiver know. Patients have the right to assessment and management of pain. The treatment of pain after surgery is important to speed up recovery and return to normal activities. Severe pain after surgery, and the fear or anxiety associated with that pain, may cause extreme discomfort that: °· Prevents sleep. °· Decreases the ability to breathe deeply and cough. This can cause pneumonia or other upper airway infections. °· Causes your heart to beat faster and your blood pressure to be higher. °· Increases the risk for constipation and bloating. °· Decreases the ability of wounds to heal. °· May result in depression, increased anxiety, and feelings of helplessness. °Relief of pain before surgery is also important because it will lessen the pain after surgery. Patients who receive both pain relief before and after surgery experience greater pain relief than those who only receive pain relief after surgery. Let your caregiver know if you are having uncontrolled pain. This is very important. Pain after surgery is more difficult to manage if it is permitted to become severe, so prompt and adequate treatment of acute pain is necessary. °PAIN CONTROL METHODS °Your caregivers follow policies and procedures about the management of patient pain. These guidelines should be explained to you before surgery. Plans for pain control after surgery must be mutually decided upon and instituted with your full understanding and agreement. Do not be afraid to ask questions regarding the care you are receiving. There are many different ways your caregivers will attempt to control your pain, including the following methods. °As needed pain control °· You may be given pain medicine either through your intravenous (IV) tube, or as a pill or  liquid you can swallow. You will need to let your caregiver know when you are having pain. Then, your caregiver will give you the pain medicine ordered for you. °· Your pain medicine may make you constipated. If constipation occurs, drink more liquids if you can. Your caregiver may have you take a mild laxative. °IV patient-controlled analgesia pump (PCA pump) °· You can get your pain medicine through the IV tube which goes into your vein. You are able to control the amount of pain medicine that you get. The pain medicine flows in through an IV tube and is controlled by a pump. This pump gives you a set amount of pain medicine when you push the button hooked up to it. Nobody should push this button but you or someone specifically assigned by you to do so. It is set up to keep you from accidentally giving yourself too much pain medicine. You will be able to start using your pain pump in the recovery room after your surgery. This method can be helpful for most types of surgery. °· If you are still having too much pain, tell your caregiver. Also, tell your caregiver if you are feeling too sleepy or nauseous. °Continuous epidural pain control °· A thin, soft tube (catheter) is put into your back. Pain medicine flows through the catheter to lessen pain in the part of your body where the surgery is done. Continuous epidural pain control may work best for you if you are having surgery on your chest, abdomen, hip area, or legs. The epidural catheter is usually put into your back just before surgery. The catheter is left in until you can eat and take medicine by mouth. In most cases,   this may take 2 to 3 days. °· Giving pain medicine through the epidural catheter may help you heal faster because: °¨ Your bowel gets back to normal faster. °¨ You can get back to eating sooner. °¨ You can be up and walking sooner. °Medicine that numbs the area (local anesthetic) °· You may receive an injection of pain medicine near where the  pain is (local infiltration). °· You may receive an injection of pain medicine near the nerve that controls the sensation to a specific part of the body (peripheral nerve block). °· Medicine may be put in the spine to block pain (spinal block). °Opioids °· Moderate to moderately severe acute pain after surgery may respond to opioids. Opioids are narcotic pain medicine. Opioids are often combined with non-narcotic medicines to improve pain relief, diminish the risk of side effects, and reduce the chance of addiction. °· If you follow your caregiver's directions about taking opioids and you do not have a history of substance abuse, your risk of becoming addicted is exceptionally small. Opioids are given for short periods of time in careful doses to prevent addiction. °Other methods of pain control include: °· Steroids. °· Physical therapy. °· Heat and cold therapy. °· Compression, such as wrapping an elastic bandage around the area of pain. °· Massage. °These various ways of controlling pain may be used together. Combining different methods of pain control is called multimodal analgesia. Using this approach has many benefits, including being able to eat, move around, and leave the hospital sooner. °Document Released: 11/06/2002 Document Revised: 11/08/2011 Document Reviewed: 11/10/2010 °[employer]® Patient Information ©2015 [employer]. This information is not intended to replace advice given to you by your health care provider. Make sure you discuss any questions you have with your health care provider. ° °

## 2014-07-28 NOTE — ED Notes (Signed)
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 No acute distress noted.

## 2014-07-28 NOTE — ED Provider Notes (Signed)
CSN: [id number]     Arrival date & time 07/28/14  1549 History  This chart was scribed for non-physician practitioner, Ladona MowJoe Osinachi Navarrette, PA-C working with Derwood KaplanAnkit Nanavati, MD by Greggory StallionKayla Andersen, ED scribe. This patient was seen in room TR04C/TR04C and the patient's care was started at 6:14 PM.   Chief Complaint  Patient presents with  . Hand Pain   The history is provided by the patient. No language interpreter was used.    HPI Comments: Shannon Barber is a 18 y.o. male who presents to the Emergency Department complaining of right hand pain that worsened earlier today while moving a bariatric pt. patient reports his pain was gradual, and he did not experience a traumatic injury. Patient denies sudden pain, only states that he feels like he uses hand too much, and it became sore after pushing a large patient. States he had surgery on his hand 6 days ago to repair a boxer's fracture. Pt has a follow up with his surgeon tomorrow. Denies numbness or tingling.    Past Medical History  Diagnosis Date  . Allergy     allergic rhinitis   . Atopic dermatitis   . Keratosis pilaris 2006    normal   Past Surgical History  Procedure Laterality Date  . Circumcision    . Wisdom tooth extraction    . Dental surgery     Family History  Problem Relation Age of Onset  . Allergies Father     foods, atopic dermatitis  . Hypertension Maternal Grandmother   . Diabetes Maternal Grandfather   . Heart disease Maternal Grandfather   . Cancer Maternal Grandfather   . Heart disease Other     CVA  . Allergies     History  Substance Use Topics  . Smoking status: Light Tobacco Smoker  . Smokeless tobacco: Never Used  . Alcohol Use: No    Review of Systems  Musculoskeletal: Positive for arthralgias.  All other systems reviewed and are negative.  Allergies  Review of patient's allergies indicates no known allergies.  Home Medications   Prior to Admission medications   Medication Sig Start Date End Date  Taking? Authorizing Provider  diphenoxylate-atropine (LOMOTIL) 2.5-0.025 MG per tablet Take 1 tablet by mouth 4 (four) times daily as needed for diarrhea or loose stools. 07/17/14   Sean Hommel, DO  ibuprofen (ADVIL,MOTRIN) 800 MG tablet Take 1 tablet (800 mg total) by mouth 3 (three) times daily. 07/12/14   April Smitty CordsK Palumbo-Rasch, MD  oxyCODONE-acetaminophen (PERCOCET) 5-325 MG per tablet Take 1-2 tablets by mouth every 6 (six) hours as needed (do not drive while taking this medication). 07/12/14   April K Palumbo-Rasch, MD   BP 110/68 mmHg  Pulse 84  Temp(Src) 98 F (36.7 C) (Oral)  Resp 20  SpO2 100%   Physical Exam  Constitutional: He is oriented to person, place, and time. He appears well-developed and well-nourished. No distress.  HENT:  Head: Normocephalic and atraumatic.  Eyes: Conjunctivae and EOM are normal.  Neck: Neck supple. No tracheal deviation present.  Cardiovascular: Normal rate.   Pulmonary/Chest: Effort normal. No respiratory distress.  Musculoskeletal: Normal range of motion.  Right hand splinted with ulnar gutter splint. 5/5 motor strength in fingers. Sensation intact. Capillary refill less than 2 seconds distally.   Neurological: He is alert and oriented to person, place, and time.  Skin: Skin is warm and dry.  Psychiatric: He has a normal mood and affect. His behavior is normal.  Nursing note and  vitals reviewed.   ED Course  Procedures (including critical care time)  DIAGNOSTIC STUDIES: Oxygen Saturation is 97% on RA, normal by my interpretation.    COORDINATION OF CARE: 6:18 PM-Discussed treatment plan which includes rest, ice and ibuprofen with pt at bedside and pt agreed to plan. Advised pt to keep his appointment tomorrow and follow up.  Labs Review Labs Reviewed - No data to display  Imaging Review No results found.   EKG Interpretation None      MDM   Final diagnoses:  Hand pain, right   Patient here after using hand pain. Patient  postop for a boxer's fracture repair. Patient states he did not have a traumatic event to the hand, however experienced and a gradual increase in soreness after pushing a bariatric patient through the hall. Patient states he feels "I twisted a little to harder than I should have. Patient states he does not feel he really injured his hand, and feels that his soreness is consistent with the same soreness he has been experiencing postoperatively. Patient states he would prefer not to have an extensive workup today, as he is supposed to follow up with Dr. Mina MarbleWeingold with hand surgery tomorrow for postop follow-up. Speaking with patient, I agreed with him that this was a reasonable option. I do not feel like there is a traumatic injury tonight, and that radiographs would not provide us with any change in therapy until he can follow-up tomorrow. I believe it is reasonable for patient to continue his home therapy and follow-up with Dr. Mina MarbleWeingold in the morning as scheduled. Patient is agreeable with this plan. I discussed return precautions with patient, and encouraged him to call or return to the ER should he have any questions or concerns.  BP 110/68 mmHg  Pulse 84  Temp(Src) 98 F (36.7 C) (Oral)  Resp 20  SpO2 100%  Signed,  Ladona MowJoe Eydan Chianese, PA-C 4:22 AM   I personally performed the services described in this documentation, which was scribed in my presence. The recorded information has been reviewed and is accurate.  Monte FantasiaJoseph W Tanisa Lagace, PA-C 07/29/14 0422  Derwood KaplanAnkit Nanavati, MD 08/04/14 [phone]

## 2014-07-28 NOTE — ED Notes (Signed)
Declined W/C at D/C and was escorted to lobby by RN. 

## 2014-07-30 ENCOUNTER — Encounter (HOSPITAL_COMMUNITY): Payer: Self-pay | Admitting: *Deleted

## 2014-12-22 ENCOUNTER — Emergency Department (HOSPITAL_COMMUNITY): Payer: 59

## 2014-12-22 ENCOUNTER — Emergency Department (HOSPITAL_COMMUNITY)
Admission: EM | Admit: 2014-12-22 | Discharge: 2014-12-22 | Disposition: A | Discharge status: Home / Self Care | Payer: 59 | Attending: Emergency Medicine | Admitting: Emergency Medicine

## 2014-12-22 ENCOUNTER — Encounter (HOSPITAL_COMMUNITY): Payer: Self-pay | Admitting: Emergency Medicine

## 2014-12-22 DIAGNOSIS — Z720 Tobacco use: Secondary | ICD-10-CM | POA: Insufficient documentation

## 2014-12-22 DIAGNOSIS — R0602 Shortness of breath: Secondary | ICD-10-CM | POA: Diagnosis present

## 2014-12-22 DIAGNOSIS — F419 Anxiety disorder, unspecified: Secondary | ICD-10-CM

## 2014-12-22 DIAGNOSIS — Y9289 Other specified places as the place of occurrence of the external cause: Secondary | ICD-10-CM | POA: Insufficient documentation

## 2014-12-22 DIAGNOSIS — S31010A Laceration without foreign body of lower back and pelvis without penetration into retroperitoneum, initial encounter: Secondary | ICD-10-CM | POA: Diagnosis not present

## 2014-12-22 DIAGNOSIS — R002 Palpitations: Secondary | ICD-10-CM

## 2014-12-22 DIAGNOSIS — Z8709 Personal history of other diseases of the respiratory system: Secondary | ICD-10-CM | POA: Diagnosis not present

## 2014-12-22 DIAGNOSIS — S21119A Laceration without foreign body of unspecified front wall of thorax without penetration into thoracic cavity, initial encounter: Secondary | ICD-10-CM | POA: Diagnosis not present

## 2014-12-22 DIAGNOSIS — Z791 Long term (current) use of non-steroidal anti-inflammatories (NSAID): Secondary | ICD-10-CM | POA: Diagnosis not present

## 2014-12-22 DIAGNOSIS — Z872 Personal history of diseases of the skin and subcutaneous tissue: Secondary | ICD-10-CM | POA: Diagnosis not present

## 2014-12-22 DIAGNOSIS — X788XXA Intentional self-harm by other sharp object, initial encounter: Secondary | ICD-10-CM | POA: Insufficient documentation

## 2014-12-22 DIAGNOSIS — Y998 Other external cause status: Secondary | ICD-10-CM | POA: Insufficient documentation

## 2014-12-22 DIAGNOSIS — Y9389 Activity, other specified: Secondary | ICD-10-CM | POA: Insufficient documentation

## 2014-12-22 DIAGNOSIS — R000 Tachycardia, unspecified: Secondary | ICD-10-CM | POA: Diagnosis not present

## 2014-12-22 HISTORY — DX: Bronchitis, not specified as acute or chronic: J40

## 2014-12-22 LAB — BASIC METABOLIC PANEL
Anion gap: 15 (ref 5–15)
BUN: 8 mg/dL (ref 6–23)
CO2: 24 mmol/L (ref 19–32)
Calcium: 9.9 mg/dL (ref 8.4–10.5)
Chloride: 102 mmol/L (ref 96–112)
Creatinine, Ser: 0.96 mg/dL (ref 0.50–1.35)
GFR calc Af Amer: >90 mL/min (ref >90)
GFR calc non Af Amer: >90 mL/min (ref >90)
GLUCOSE: 108 mg/dL — AB (ref 70–99)
POTASSIUM: 3 mmol/L — AB (ref 3.5–5.1)
SODIUM: 141 mmol/L (ref 135–145)

## 2014-12-22 LAB — CBC
HEMATOCRIT: 47.3 % (ref 39.0–52.0)
HEMOGLOBIN: 16.5 g/dL (ref 13.0–17.0)
MCH: 30.8 pg (ref 26.0–34.0)
MCHC: 34.9 g/dL (ref 30.0–36.0)
MCV: 88.4 fL (ref 78.0–100.0)
Platelets: 257 10*3/uL (ref 150–400)
RBC: 5.35 MIL/uL (ref 4.22–5.81)
RDW: 12.5 % (ref 11.5–15.5)
WBC: 12.8 10*3/uL — ABNORMAL HIGH (ref 4.0–10.5)

## 2014-12-22 LAB — D-DIMER, QUANTITATIVE: D-Dimer, Quant: <0.27 ug/mL-FEU (ref 0.00–0.48)

## 2014-12-22 MED ORDER — LEVALBUTEROL HCL 1.25 MG/3ML IN NEBU: 1.2500 mg | INHALATION_SOLUTION | Freq: Once | RESPIRATORY_TRACT | Status: DC

## 2014-12-22 MED ORDER — IPRATROPIUM-ALBUTEROL 0.5-2.5 (3) MG/3ML IN SOLN: 3.0000 mL | Freq: Once | RESPIRATORY_TRACT | Status: DC

## 2014-12-22 MED ORDER — SODIUM CHLORIDE 0.9 % IV BOLUS (SEPSIS)
1000.0000 mL | Freq: Once | INTRAVENOUS | Status: AC
  Administered 2014-12-22: 1000 mL via INTRAVENOUS

## 2014-12-22 MED ORDER — POTASSIUM CHLORIDE CRYS ER 20 MEQ PO TBCR
40.0000 meq | EXTENDED_RELEASE_TABLET | Freq: Once | ORAL | Status: AC
  Administered 2014-12-22: 40 meq via ORAL
  Filled 2014-12-22: qty 2

## 2014-12-22 MED ORDER — LORAZEPAM 1 MG PO TABS
1.0000 mg | ORAL_TABLET | Freq: Once | ORAL | Status: AC
  Administered 2014-12-22: 1 mg via ORAL
  Filled 2014-12-22: qty 1

## 2014-12-22 MED ORDER — LORAZEPAM 1 MG PO TABS: 1.0000 mg | ORAL_TABLET | Freq: Three times a day (TID) | ORAL | Status: DC | PRN

## 2014-12-22 NOTE — ED Provider Notes (Signed)
CSN: [id number]     Arrival date & time 12/22/14  0603 History   First MD Initiated Contact with Patient 12/22/14 [phone]     Chief Complaint  Patient presents with  . Shortness of Breath  . Tachycardia     (Consider location/radiation/quality/duration/timing/severity/associated sxs/prior Treatment) HPI Shannon Barber is a 19 y.o. male with history of bronchitis, presents to emergency department complaining of shortness of breath. Patient states this has been going on for several months. He states is worse at night when he lays down. She reports like he "cannot take enough air and to satisfy his needs." He states he has been going through a lot recently. 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 He states he was seeing a therapist for his mental issues but currently unable to do so because of his job schedule. Patient is a smoker but states he is cutting down. He admits to cough, clear sputum. He states sometimes when he feels short of breath he will go and smoke a cigarette and it makes him feel better. He admits to palpitations. Denies any chest pain. He states he is feeling with his stress by self inflicting cuts to his chest and his back. He states "it's a coping mechanism." He denies any recent travel or surgeries. He denies any cardiac history. No other complaints   Past Medical History  Diagnosis Date  . Allergy     allergic rhinitis   . Atopic dermatitis   . Keratosis pilaris 2006    normal  . Bronchitis    Past Surgical History  Procedure Laterality Date  . Oral gum graft surgery    . Open reduction internal fixation (orif) metacarpal Right 07/22/2014    Procedure: OPEN REDUCTION INTERNAL FIXATION (ORIF) RIGHT SMALL  METACARPAL FRACTURE ;  Surgeon: Dairl Ponder, MD;  [facility]  [facility];  Service: Orthopedics;  Laterality: Right;  . Circumcision    . Wisdom tooth extraction    . Dental surgery     Family  History  Problem Relation Age of Onset  . Allergies Father     foods, atopic dermatitis  . Hypertension Maternal Grandmother   . Diabetes Maternal Grandfather   . Heart disease Maternal Grandfather   . Cancer Maternal Grandfather   . Heart disease Other     CVA  . Allergies     History  Substance Use Topics  . Smoking status: Light Tobacco Smoker    Types: Cigarettes  . Smokeless tobacco: Never Used     Comment: 2-3 packs   . Alcohol Use: No     Comment: drinks once every 2 months     Review of Systems  Constitutional: Negative for fever and chills.  Respiratory: Positive for cough and shortness of breath. Negative for chest tightness.   Cardiovascular: Positive for palpitations. Negative for chest pain and leg swelling.  Gastrointestinal: Negative for nausea, vomiting, abdominal pain, diarrhea and abdominal distention.  Musculoskeletal: Negative for myalgias, arthralgias, neck pain and neck stiffness.  Skin: Negative for rash.  Allergic/Immunologic: Negative for immunocompromised state.  Neurological: Negative for dizziness, weakness, light-headedness, numbness and headaches.  Psychiatric/Behavioral: The patient is nervous/anxious.   All other systems reviewed and are negative.     Allergies  Review of patient's allergies indicates no known allergies.  Home Medications   Prior to Admission medications   Medication Sig Start Date End Date Taking? Authorizing Provider  diphenoxylate-atropine (LOMOTIL) 2.5-0.025 MG per  tablet Take 1 tablet by mouth 4 (four) times daily as needed for diarrhea or loose stools. 07/17/14   Sean Hommel, DO  diphenoxylate-atropine (LOMOTIL) 2.5-0.025 MG per tablet Take 1 tablet by mouth 4 (four) times daily as needed for diarrhea or loose stools.    Historical Provider, MD  ibuprofen (ADVIL,MOTRIN) 800 MG tablet Take 1 tablet (800 mg total) by mouth 3 (three) times daily. 07/12/14   April Palumbo, MD  ibuprofen (ADVIL,MOTRIN) 800 MG tablet Take  800 mg by mouth every 8 (eight) hours as needed.    Historical Provider, MD  oxyCODONE-acetaminophen (PERCOCET) 5-325 MG per tablet Take 1-2 tablets by mouth every 6 (six) hours as needed (do not drive while taking this medication). 07/12/14   April Palumbo, MD  oxyCODONE-acetaminophen (ROXICET) 5-325 MG per tablet Take 1 tablet by mouth every 4 (four) hours as needed for severe pain. 07/22/14   Dairl Ponder, MD   BP 140/86 mmHg  Pulse 120  Temp(Src) 98.3 F (36.8 C) (Oral)  Resp 24  Ht  (1.905 m)  Wt 145 lb (65.772 kg)  BMI 18.12 kg/m2  SpO2 100% Physical Exam  Constitutional: He is oriented to person, place, and time. He appears well-developed and well-nourished.  Anxious appearing  HENT:  Head: Normocephalic and atraumatic.  Eyes: Conjunctivae are normal.  Neck: Neck supple.  Cardiovascular: Regular rhythm and normal heart sounds.   Tachycardic  Pulmonary/Chest: Effort normal. No respiratory distress. He has no wheezes. He has no rales.  Tachypnea, hyperventilating  Abdominal: Soft. Bowel sounds are normal. He exhibits no distension. There is no tenderness. There is no rebound.  Musculoskeletal: He exhibits no edema.  Neurological: He is alert and oriented to person, place, and time. No cranial nerve deficit. Coordination normal.  Skin: Skin is warm and dry.  Multiple new and old superficial lacerations and abrasions to the chest and lower back bilaterally  Nursing note and vitals reviewed.   ED Course  Procedures (including critical care time) Labs Review Labs Reviewed  BASIC METABOLIC PANEL - Abnormal; Notable for the following:    Potassium 3.0 (*)    Glucose, Bld 108 (*)    All other components within normal limits  CBC - Abnormal; Notable for the following:    WBC 12.8 (*)    All other components within normal limits    Imaging Review Dg Chest 2 View (if Patient Has Fever And/or Copd)  12/22/2014   CLINICAL DATA:  Chronic worsening shortness of breath.  Tachycardia. Initial encounter.  EXAM: CHEST  2 VIEW  COMPARISON:  None.  FINDINGS: The lungs are well-aerated and clear. There is no evidence of focal opacification, pleural effusion or pneumothorax.  The heart is normal in size; the mediastinal contour is within normal limits. No acute osseous abnormalities are seen.  IMPRESSION: No acute cardiopulmonary process seen.   Electronically Signed   By: Roanna Raider M.D.   On: 12/22/2014 07:00     EKG Interpretation   Date/Time:  Sunday December 22 2014 06:11:32 EDT Ventricular Rate:  107 PR Interval:  138 QRS Duration: 108 QT Interval:  328 QTC Calculation: 438 R Axis:   89 Text Interpretation:  Sinus tachycardia RSR' in V1 or V2, right VCD or RVH  No old tracing to compare Confirmed by OTTER  MD, OLGA ([id number]) on  12/22/2014 6:14:35 AM      MDM   Final diagnoses:  Shortness of breath  Palpitations  Anxiety    Patient emergency department with  shortness of breath, palpitations, appears very anxious on initial evaluation, hyperventilating. I suspect his symptoms are most likely due to anxiety. Will get chest x-ray, labs including d-dimer, EKG   EKG showing sinus tachycardia. Heart rate came down with Ativan, and he feels much better. He's no longer hyperventilating. D-dimer labs unremarkable other than potassium slightly low. Given 40 mEq in the emergency department. Home with follow-up. I have instructed him to try to get a psychiatrist and therapist, also follow-up with primary care doctor. I have prescribed him 10 tablets of Ativan to take as needed. Patient agreed.  Filed Vitals:   12/22/14 0617 12/22/14 0724 12/22/14 0845  BP: 140/86 117/78 127/57  Pulse: 120 85 93  Temp: 98.3 F (36.8 C)    TempSrc: Oral    Resp: 24 15 17   Height: 6\' 3"  (1.905 m)    Weight: 145 lb (65.772 kg)    SpO2: 100% 100% 100%        Jaynie Crumbleatyana Tevis Conger, PA-C 12/22/14 [id number]  Arby BarretteMarcy Pfeiffer, MD 12/28/14 [phone]

## 2014-12-22 NOTE — Discharge Instructions (Signed)
Please follow up with psychiatrist, therapist, and your primary care doctor for recheck.   Shortness of Breath Shortness of breath means you have trouble breathing. It could also mean that you have a medical problem. You should get immediate medical care for shortness of breath. CAUSES   Not enough oxygen in the air such as with high altitudes or a smoke-filled room.  Certain lung diseases, infections, or problems.  Heart disease or conditions, such as angina or heart failure.  Low red blood cells (anemia).  Poor physical fitness, which can cause shortness of breath when you exercise.  Chest or back injuries or stiffness.  Being overweight.  Smoking.  Anxiety, which can make you feel like you are not getting enough air. DIAGNOSIS  Serious medical problems can often be found during your physical exam. Tests may also be done to determine why you are having shortness of breath. Tests may include:  Chest X-rays.  Lung function tests.  Blood tests.  An electrocardiogram (ECG).  An ambulatory electrocardiogram. An ambulatory ECG records your heartbeat patterns over a 24-hour period.  Exercise testing.  A transthoracic echocardiogram (TTE). During echocardiography, sound waves are used to evaluate how blood flows through your heart.  A transesophageal echocardiogram (TEE).  Imaging scans. Your health care provider may not be able to find a cause for your shortness of breath after your exam. In this case, it is important to have a follow-up exam with your health care provider as directed.  TREATMENT  Treatment for shortness of breath depends on the cause of your symptoms and can vary greatly. HOME CARE INSTRUCTIONS   Do not smoke. Smoking is a common cause of shortness of breath. If you smoke, ask for help to quit.  Avoid being around chemicals or things that may bother your breathing, such as paint fumes and dust.  Rest as needed. Slowly resume your usual  activities.  If medicines were prescribed, take them as directed for the full length of time directed. This includes oxygen and any inhaled medicines.  Keep all follow-up appointments as directed by your health care provider. SEEK MEDICAL CARE IF:   Your condition does not improve in the time expected.  You have a hard time doing your normal activities even with rest.  You have any new symptoms. SEEK IMMEDIATE MEDICAL CARE IF:   Your shortness of breath gets worse.  You feel light-headed, faint, or develop a cough not controlled with medicines.  You start coughing up blood.  You have pain with breathing.  You have chest pain or pain in your arms, shoulders, or abdomen.  You have a fever.  You are unable to walk up stairs or exercise the way you normally do. MAKE SURE YOU:  Understand these instructions.  Will watch your condition.  Will get help right away if you are not doing well or get worse. Document Released: 05/11/2001 Document Revised: 08/21/2013 Document Reviewed: 11/01/2011 [facility] Patient Information 2015 [place], [place]. This information is not intended to replace advice given to you by your health care provider. Make sure you discuss any questions you have with your health care provider.   Emergency Department Resource Guide 1) Find a Doctor and Pay Out of Pocket Although you won't have to find out who is covered by your insurance plan, it is a good idea to ask around and get recommendations. You will then need to call the office and see if the doctor you have chosen will accept you as a new patient  and what types of options they offer for patients who are self-pay. Some doctors offer discounts or will set up payment plans for their patients who do not have insurance, but you will need to ask so you aren't surprised when you get to your appointment.  2) Contact Your Local Health Department Not all health departments have doctors that can see patients for sick  visits, but many do, so it is worth a call to see if yours does. If you don't know where your local health department is, you can check in your phone book. The CDC also has a tool to help you locate your state's health department, and many state websites also have listings of all of their local health departments.  3) Find a Walk-in Clinic If your illness is not likely to be very severe or complicated, you may want to try a walk in clinic. These are popping up all over the country in pharmacies, drugstores, and shopping centers. They're usually staffed by nurse practitioners or physician assistants that have been trained to treat common illnesses and complaints. They're usually fairly quick and inexpensive. However, if you have serious medical issues or chronic medical problems, these are probably not your best option.  No Primary Care Doctor: - Call Health Connect at  [phone] - they can help you locate a primary care doctor that  accepts your insurance, provides certain services, etc. - Physician Referral Service- [phone]  Chronic Pain Problems: Organization         Address  Phone   Notes  [facility]  [phone] Patients need to be referred by their primary care doctor.   Medication Assistance: Organization         Address  Phone   Notes  [facility] Medication [facility] [address] [place], [place] [address] [phone]) [phone] --Must be a resident of [facility] [place] -- Must have NO insurance coverage whatsoever (no Medicaid/ Medicare, etc.) -- The pt. MUST have a primary care doctor that directs their care regularly and follows them in the community   [facility]ist  [phone]   [employer]  [phone]    Agencies that provide inexpensive medical care: Organization         Address  Phone   Notes  [facility]  [phone]) [phone]   [facility]    [phone]   [facility] [address] [place], [place] [address] [phone]   [facility] [address], [place] [phone]) [phone]   [facility]    [phone]   [facility]    [phone]   [facility]  [address], [place] Phone:  [phone], Fax:  [phone] Hours of Operation:  9 am - 6 pm, M-F.  Also accepts Medicaid/Medicare and self-pay.  [facility]  [address], [place] Phone: [phone]) [phone], Fax: [phone]. Hours of Operation:  8:30 am - 5:30 pm, M-F.  Also accepts Medicaid and self-pay.  [facility] [place] [address], [place] Phone: [phone]   [facility] [address], [place], [place] [phone], Ext. [phone] Mondays & Thursdays: 7-9 AM.  First 15 patients are seen on a first come, first serve basis.    Medicaid-accepting [facility] Providers:  Retail buyerrganization         Address  Phone  Notes  [facility] [address], [place] [phone] Also accepts self-pay patients.  [facility] [address], [place]  [phone]   [facility] [address], [place] [phone]) [phone]   [facility] [address] [place] [address], [place] [phone]   Renaye RakersVeita Bland [address], [place]   [phone]) [phone] Only accepts [place] Access [place] patients after they have their name applied to their card.   Self-Pay (no insurance) in [facility] [place]:  Organization         Address  Phone   Notes  Sickle Cell Patients, [facility] [address], [place] [phone]) [phone]   [facility] [address], [place] [phone]   [facility] [place]  [address], [place] [phone]   [facility]/Dr. Osei-Bonsu  [address] [place] [address],  [place] or [id number], [place] [phone] Phone number for both [place] and [place] locations is the same.  [facility] [address], [place] [phone]   [facility] [place] [address] [place] [address], [place] or [address] [phone]   [facility] [address], [place] [phone]) [phone], phone; [phone](336) [phone], fax Sees patients 1st and 3rd Saturday of every month.  Must not qualify for public or private insurance (i.e. Medicaid, Medicare, [place] Health Choice, Veterans' Benefits)  Household income should be no more than 200% of the poverty level The clinic cannot treat you if you are pregnant or think you are pregnant  Sexually transmitted diseases are not treated at the clinic.     Behavioral Health Resources in the Community: Intensive Outpatient Programs Organization         Address  Phone  Notes  [facility] [address], [place], [place] [phone]   [facility] [address], [place], [place] [phone]   [facility] [address], [place], [place]  [phone]   [facility] [address],  [place], [place] [phone] or [phone]   Substance Abuse Resources Organization         Address  Phone  Notes  [facility]  [phone]   [facility]  [phone]   [facility]  [phone]   [facility]  [phone]   Residential & Outpatient Substance Abuse Program  [phone]   Psychological Services Organization         Address  Phone  Notes  [facility]  [phone]- [phone]   [facility]  [phone]- [phone]   [facility] [address], [place] [phone] or [phone]    Mobile Crisis Teams Organization         Address  Phone  Notes  [facility], Mobile Crisis Care Unit  [phone]    [facility]  [address] [place], [place] [phone]   Doristine LocksSharon DeEsch [address] [place] [phone]    Self-Help/Support Groups Organization         Address  Phone             Notes  [facility] [place] - variety of support groups  336- [phone] Call for more information  Narcotics Anonymous (NA), Caring  Services [address] [employer] [place]  2 meetings at this location   Residential Sports administrator         Address  Phone  Notes  [facility] [address],    [place]  [phone]   [facility]  [address], [place] [address], [place], [place] [phone]   [facility] [address] [place], [place] [phone] Admissions: 8am-3pm M-F  [facility] [address],    [place], [place] [phone]   [facility] [address] [place], [place], [place] [phone]   [facility] [address],  [place], [place] [phone]   [facility] - Intensive Outpatient [address], [place], [place] [phone]   [facility] ([facility] [address].,  [place], [place] [phone] or [phone]   [facility] ([facility]) [address] [place], [place] [phone] Accepts Medicaid  [facility] [address]  [place] [phone] Substance Abuse/Addiction Treatment   [facility] Organization         Address  Phone  Notes  [facility]  [phone]   Angie Fava, PhD [address] [place], [place]   [phone] or [phone]   [facility]   [address] [place], [place] [phone]   [facility] [address], [place], [place] [phone] Insurance/Medicaid/sponsorship through [facility] Families [address]                                     [place], [place] [phone] Therapy/tele-psych/case  [facility] [address], [place] [phone]    Dr. Lolly Mustache  [phone]   [facility]  United [employer] [facility] Dept. 1) [address], [place] 2) [address], [place] 3)  [address], [place] [phone]  [phone]   [facility] Child Abuse Hotline [phone] or [phone] (After Hours)      Generalized Anxiety Disorder Generalized anxiety disorder (GAD) is a mental disorder. It interferes with life functions, including relationships, work, and school. GAD is different from normal anxiety, which everyone experiences at some point in their lives in response to specific life events and activities. Normal anxiety actually helps [place] prepare for and get through these life events and activities. Normal anxiety goes away after the event or activity is over.  GAD causes anxiety that is not necessarily related to specific events or activities. It also causes excess anxiety in proportion to specific events or activities. The anxiety associated with GAD is also difficult to control. GAD can vary from mild to severe. People with severe GAD can have intense waves of anxiety with physical symptoms (panic attacks).  SYMPTOMS The anxiety and worry associated with GAD are difficult to control. This anxiety and worry are related to many life events and activities and also occur more days than not for 6 months or longer. People with GAD also have three or more of the following symptoms (one or more in children):  Restlessness.   Fatigue.  Difficulty concentrating.   Irritability.  Muscle tension.  Difficulty sleeping or unsatisfying sleep. DIAGNOSIS GAD is diagnosed through an assessment by your health care provider. Your health care provider  will ask you questions aboutyour mood,physical symptoms, and events in your life. Your health care  provider may ask you about your medical history and use of alcohol or drugs, including prescription medicines. Your health care provider may also do a physical exam and blood tests. Certain medical conditions and the use of certain substances can cause symptoms similar to those associated with GAD. Your health care provider may refer you to a mental health specialist for further evaluation. TREATMENT The following therapies are usually used to treat GAD:   Medication. Antidepressant medication usually is prescribed for long-term daily control. Antianxiety medicines may be added in severe cases, especially when panic attacks occur.   Talk therapy (psychotherapy). Certain types of talk therapy can be helpful in treating GAD by providing support, education, and guidance. A form of talk therapy called cognitive behavioral therapy can teach you healthy ways to think about and react to daily life events and activities.  Stress managementtechniques. These include yoga, meditation, and exercise and can be very helpful when they are practiced regularly. A mental health specialist can help determine which treatment is best for you. Some people see improvement with one therapy. However, other people require a combination of therapies. Document Released: 12/11/2012 Document Revised: 12/31/2013 Document Reviewed: 12/11/2012 [facility] Patient Information 2015 [place], [place]. This information is not intended to replace advice given to you by your health care provider. Make sure you discuss any questions you have with your health care provider.

## 2014-12-22 NOTE — ED Notes (Addendum)
Pt arrives with c/o shortness of breath ongoing for three weeks. States hx of bronchitis on and off all winter. Cough, productive, bloody, clear phlegm

## 2014-12-22 NOTE — ED Notes (Signed)
Pt provided Malawiturkey sandwich and sprite. Pt tolerating well .

## 2015-03-16 ENCOUNTER — Emergency Department (HOSPITAL_BASED_OUTPATIENT_CLINIC_OR_DEPARTMENT_OTHER)
Admission: EM | Admit: 2015-03-16 | Discharge: 2015-03-16 | Disposition: A | Discharge status: Home / Self Care | Payer: 59 | Attending: Emergency Medicine | Admitting: Emergency Medicine

## 2015-03-16 ENCOUNTER — Emergency Department (HOSPITAL_BASED_OUTPATIENT_CLINIC_OR_DEPARTMENT_OTHER): Payer: 59

## 2015-03-16 ENCOUNTER — Encounter (HOSPITAL_BASED_OUTPATIENT_CLINIC_OR_DEPARTMENT_OTHER): Payer: Self-pay | Admitting: *Deleted

## 2015-03-16 DIAGNOSIS — Y998 Other external cause status: Secondary | ICD-10-CM | POA: Insufficient documentation

## 2015-03-16 DIAGNOSIS — Z8709 Personal history of other diseases of the respiratory system: Secondary | ICD-10-CM | POA: Insufficient documentation

## 2015-03-16 DIAGNOSIS — S5001XA Contusion of right elbow, initial encounter: Secondary | ICD-10-CM | POA: Insufficient documentation

## 2015-03-16 DIAGNOSIS — W228XXA Striking against or struck by other objects, initial encounter: Secondary | ICD-10-CM | POA: Diagnosis not present

## 2015-03-16 DIAGNOSIS — Q829 Congenital malformation of skin, unspecified: Secondary | ICD-10-CM | POA: Insufficient documentation

## 2015-03-16 DIAGNOSIS — Z872 Personal history of diseases of the skin and subcutaneous tissue: Secondary | ICD-10-CM | POA: Insufficient documentation

## 2015-03-16 DIAGNOSIS — Y9389 Activity, other specified: Secondary | ICD-10-CM | POA: Diagnosis not present

## 2015-03-16 DIAGNOSIS — Z79899 Other long term (current) drug therapy: Secondary | ICD-10-CM | POA: Diagnosis not present

## 2015-03-16 DIAGNOSIS — Z720 Tobacco use: Secondary | ICD-10-CM | POA: Diagnosis not present

## 2015-03-16 DIAGNOSIS — Y9289 Other specified places as the place of occurrence of the external cause: Secondary | ICD-10-CM | POA: Insufficient documentation

## 2015-03-16 DIAGNOSIS — S59901A Unspecified injury of right elbow, initial encounter: Secondary | ICD-10-CM | POA: Diagnosis present

## 2015-03-16 NOTE — ED Provider Notes (Signed)
CSN: [id number]     Arrival date & time 03/16/15  4098 History   First MD Initiated Contact with Patient 03/16/15 [phone]     Chief Complaint  Patient presents with  . Joint Swelling     (Consider location/radiation/quality/duration/timing/severity/associated sxs/prior Treatment) HPI  19 year old male presents after injuring his right elbow at approximately 11:30 PM last night. Patient elbow into the wall out of anger. Noticed immediate swelling and bruising. Since then the pain is not too bad as long as he does not range his elbow. Declines anything for pain. Intermittently has felt numbness and tingling in his fingers but denies any now. No other injuries.  Past Medical History  Diagnosis Date  . Allergy     allergic rhinitis   . Atopic dermatitis   . Keratosis pilaris 2006    normal  . Bronchitis    Past Surgical History  Procedure Laterality Date  . Oral gum graft surgery    . Open reduction internal fixation (orif) metacarpal Right 07/22/2014    Procedure: OPEN REDUCTION INTERNAL FIXATION (ORIF) RIGHT SMALL  METACARPAL FRACTURE ;  Surgeon: Dairl Ponder, MD;  Location: [facility];  Service: Orthopedics;  Laterality: Right;  . Circumcision    . Wisdom tooth extraction    . Dental surgery     Family History  Problem Relation Age of Onset  . Allergies Father     foods, atopic dermatitis  . Hypertension Maternal Grandmother   . Diabetes Maternal Grandfather   . Heart disease Maternal Grandfather   . Cancer Maternal Grandfather   . Heart disease Other     CVA  . Allergies     History  Substance Use Topics  . Smoking status: Light Tobacco Smoker    Types: Cigarettes  . Smokeless tobacco: Never Used     Comment: 2-3 packs   . Alcohol Use: No     Comment: drinks once every 2 months     Review of Systems  Musculoskeletal: Positive for joint swelling and arthralgias.  Skin: Negative for wound.  Neurological: Positive for numbness. Negative for  weakness.  All other systems reviewed and are negative.     Allergies  Review of patient's allergies indicates no known allergies.  Home Medications   Prior to Admission medications   Medication Sig Start Date End Date Taking? Authorizing Provider  albuterol (PROVENTIL HFA;VENTOLIN HFA) 108 (90 BASE) MCG/ACT inhaler Inhale 1 puff into the lungs every 6 (six) hours as needed for wheezing or shortness of breath.    Historical Provider, MD  diphenoxylate-atropine (LOMOTIL) 2.5-0.025 MG per tablet Take 1 tablet by mouth 4 (four) times daily as needed for diarrhea or loose stools. Patient not taking: Reported on 12/22/2014 07/17/14   Laren Boom, DO  ibuprofen (ADVIL,MOTRIN) 800 MG tablet Take 1 tablet (800 mg total) by mouth 3 (three) times daily. Patient not taking: Reported on 12/22/2014 07/12/14   April Palumbo, MD  LORazepam (ATIVAN) 1 MG tablet Take 1 tablet (1 mg total) by mouth 3 (three) times daily as needed for anxiety. 12/22/14   Tatyana Kirichenko, PA-C  oxyCODONE-acetaminophen (PERCOCET) 5-325 MG per tablet Take 1-2 tablets by mouth every 6 (six) hours as needed (do not drive while taking this medication). Patient not taking: Reported on 12/22/2014 07/12/14   April Palumbo, MD  oxyCODONE-acetaminophen (ROXICET) 5-325 MG per tablet Take 1 tablet by mouth every 4 (four) hours as needed for severe pain. Patient not taking: Reported on 12/22/2014 07/22/14   Dairl Ponder,  MD   BP 122/71 mmHg  Pulse 86  Temp(Src) 98.7 F (37.1 C) (Oral)  Resp 18  Ht 6\' 3"  (1.905 m)  Wt 155 lb (70.308 kg)  BMI 19.37 kg/m2  SpO2 99% Physical Exam  Constitutional: He is oriented to person, place, and time. He appears well-developed and well-nourished.  HENT:  Head: Normocephalic and atraumatic.  Right Ear: External ear normal.  Left Ear: External ear normal.  Nose: Nose normal.  Eyes: Right eye exhibits no discharge. Left eye exhibits no discharge.  Neck: Neck supple.  Cardiovascular: Normal  rate and intact distal pulses.   Pulses:      Radial pulses are 2+ on the right side, and 2+ on the left side.  Pulmonary/Chest: Effort normal.  Abdominal: He exhibits no distension.  Musculoskeletal: He exhibits no edema.       Right elbow: He exhibits decreased range of motion and swelling. He exhibits no deformity. Tenderness found.       Arms: Normal sensation and grip strength in RUE  Neurological: He is alert and oriented to person, place, and time.  Skin: Skin is warm and dry.  Nursing note and vitals reviewed.   ED Course  Procedures (including critical care time) Labs Review Labs Reviewed - No data to display  Imaging Review Dg Elbow Complete Right  03/16/2015   CLINICAL DATA:  Hit wall with right elbow while working.  EXAM: RIGHT ELBOW - COMPLETE 3+ VIEW  COMPARISON:  None.  FINDINGS: There is no evidence of fracture, dislocation, or joint effusion. There is no evidence of arthropathy or other focal bone abnormality. Soft tissues are unremarkable.  IMPRESSION: Negative.   Electronically Signed   By: Signa Kellaylor  Stroud M.D.   On: 03/16/2015 09:11     EKG Interpretation None      MDM   Final diagnoses:  Elbow contusion, right, initial encounter    Patient is neurovascularly intact. Declines pain medicine at this time. Patient's elbow has significant swelling that is consistent with a moderate bruise. Patient's x-ray shows no acute fracture or dislocation. Recommend RICE and follow up as needed with PCP.    Pricilla LovelessScott Shyla Gayheart, MD 03/16/15 [phone]

## 2015-03-16 NOTE — ED Notes (Signed)
Rt elbow swelling, hit elbow against concrete wall, swelling noted

## 2015-03-16 NOTE — ED Notes (Signed)
MD at bedside. 

## 2015-03-16 NOTE — ED Notes (Signed)
Ice pack to rt elbow

## 2015-03-16 NOTE — ED Notes (Signed)
Patient transported to X-ray 

## 2015-10-16 ENCOUNTER — Encounter: Payer: Self-pay | Admitting: Family Medicine

## 2015-10-16 ENCOUNTER — Ambulatory Visit (INDEPENDENT_AMBULATORY_CARE_PROVIDER_SITE_OTHER): Payer: 59 | Admitting: Family Medicine

## 2015-10-16 VITALS — BP 116/64 | HR 77 | Wt 176.0 lb

## 2015-10-16 DIAGNOSIS — F528 Other sexual dysfunction not due to a substance or known physiological condition: Secondary | ICD-10-CM | POA: Insufficient documentation

## 2015-10-16 MED ORDER — VENLAFAXINE HCL ER 75 MG PO CP24: 75.0000 mg | ORAL_CAPSULE | Freq: Every day | ORAL | Status: DC

## 2015-10-16 NOTE — Progress Notes (Signed)
CC: Shannon Barber is a 20 y.o. male is here for Sexual Problem   Subjective: HPI:  He's concerned that his sexual interest and libido might be greater than normal. Ever since he was a teenager he has had difficulty pushing up sexual thoughts from his consciousness on a daily basis. It's interfering with work and causing awkward interactions with others. He is not gotten in trouble with anybody nor has he acted inappropriately any situations but he is worried that he might one day slip up and act in a socially inappropriate manner. He denies any manic activity or difficulty with keeping relationships or finances. He denies any recent or remote trouble with the law. He wants a medication that can decrease his libido. He denies any genitourinary complaints.   Review Of Systems Outlined In HPI  Past Medical History  Diagnosis Date  . Allergy     allergic rhinitis   . Atopic dermatitis   . Keratosis pilaris 2006    normal  . Bronchitis     Past Surgical History  Procedure Laterality Date  . Oral gum graft surgery    . Open reduction internal fixation (orif) metacarpal Right 07/22/2014    Procedure: OPEN REDUCTION INTERNAL FIXATION (ORIF) RIGHT SMALL  METACARPAL FRACTURE ;  Surgeon: Dairl Ponder, MD;  Location: [facility];  Service: Orthopedics;  Laterality: Right;  . Circumcision    . Wisdom tooth extraction    . Dental surgery     Family History  Problem Relation Age of Onset  . Allergies Father     foods, atopic dermatitis  . Hypertension Maternal Grandmother   . Diabetes Maternal Grandfather   . Heart disease Maternal Grandfather   . Cancer Maternal Grandfather   . Heart disease Other     CVA  . Allergies      Social History   Social History  . Marital Status: Single    Spouse Name: N/A  . Number of Children: N/A  . Years of Education: N/A   Occupational History  . Not on file.   Social History Main Topics  . Smoking status: Light Tobacco Smoker     Types: Cigarettes  . Smokeless tobacco: Never Used     Comment: 2-3 packs   . Alcohol Use: No     Comment: drinks once every 2 months   . Drug Use: No     Comment: smoked 1 marijuana  . Sexual Activity: Not Currently     Comment: lives with parents, home schooled, enjoys baseball, planes, wants to be a pilot, no smoking in home, no pets, follows lactose-free diet, vegetarian diet, takes B 12 daily.   Other Topics Concern  . Not on file   Social History Narrative   ** Merged History Encounter **         Objective: BP 116/64 mmHg  Pulse 77  Wt 176 lb (79.833 kg)  Vital signs reviewed. General: Alert and Oriented, No Acute Distress HEENT: Pupils equal, round, reactive to light. Conjunctivae clear.  External ears unremarkable.  Moist mucous membranes. Lungs: Clear and comfortable work of breathing, speaking in full sentences without accessory muscle use. Cardiac: Regular rate and rhythm.  Neuro: CN II-XII grossly intact, gait normal. Extremities: No peripheral edema.  Strong peripheral pulses.  Mental Status: No depression, anxiety, nor agitation. Logical though process. Skin: Warm and dry.  Assessment & Plan: Baudelio was seen today for sexual problem.  Diagnoses and all orders for this visit:  Excessive sexual  desire in men  Other orders -     venlafaxine XR (EFFEXOR XR) 75 MG 24 hr capsule; Take 1 capsule (75 mg total) by mouth daily with breakfast. May increase to two every morning after one week if needed.   Discussed the common side effect of decreased libido with a Friday of anti-depression medications. He is interested in taking Effexor to help lower his libido. I've asked him to call me in 1-2 weeks to let me know how its working.  Return if symptoms worsen or fail to improve.

## 2015-11-05 ENCOUNTER — Telehealth: Payer: Self-pay

## 2015-11-05 NOTE — Telephone Encounter (Signed)
Pt reports that Effexor is working for with no side effects.  He stated that he's only taking 1 qd and not 2.

## 2015-11-06 MED ORDER — VENLAFAXINE HCL ER 75 MG PO CP24: 75.0000 mg | ORAL_CAPSULE | Freq: Every day | ORAL | Status: DC

## 2015-11-06 NOTE — Telephone Encounter (Signed)
Thank you, med list has been updated.

## 2016-03-14 ENCOUNTER — Emergency Department (HOSPITAL_COMMUNITY): Payer: 59

## 2016-03-14 ENCOUNTER — Encounter (HOSPITAL_COMMUNITY): Payer: Self-pay | Admitting: Emergency Medicine

## 2016-03-14 ENCOUNTER — Emergency Department (HOSPITAL_COMMUNITY)
Admission: EM | Admit: 2016-03-14 | Discharge: 2016-03-14 | Disposition: A | Discharge status: Home / Self Care | Payer: 59 | Attending: Emergency Medicine | Admitting: Emergency Medicine

## 2016-03-14 DIAGNOSIS — Y990 Civilian activity done for income or pay: Secondary | ICD-10-CM | POA: Insufficient documentation

## 2016-03-14 DIAGNOSIS — S61214A Laceration without foreign body of right ring finger without damage to nail, initial encounter: Secondary | ICD-10-CM | POA: Diagnosis not present

## 2016-03-14 DIAGNOSIS — Y9289 Other specified places as the place of occurrence of the external cause: Secondary | ICD-10-CM | POA: Insufficient documentation

## 2016-03-14 DIAGNOSIS — S61411A Laceration without foreign body of right hand, initial encounter: Secondary | ICD-10-CM | POA: Diagnosis not present

## 2016-03-14 DIAGNOSIS — Y939 Activity, unspecified: Secondary | ICD-10-CM | POA: Insufficient documentation

## 2016-03-14 DIAGNOSIS — W25XXXA Contact with sharp glass, initial encounter: Secondary | ICD-10-CM | POA: Diagnosis not present

## 2016-03-14 DIAGNOSIS — F17210 Nicotine dependence, cigarettes, uncomplicated: Secondary | ICD-10-CM | POA: Diagnosis not present

## 2016-03-14 DIAGNOSIS — S61216A Laceration without foreign body of right little finger without damage to nail, initial encounter: Secondary | ICD-10-CM | POA: Insufficient documentation

## 2016-03-14 MED ORDER — LIDOCAINE HCL 2 % IJ SOLN
10.0000 mL | Freq: Once | INTRAMUSCULAR | Status: AC
  Administered 2016-03-14: 200 mg via INTRADERMAL
  Filled 2016-03-14: qty 20

## 2016-03-14 NOTE — Discharge Instructions (Signed)
Keep clean and dry. Apply bacitracin twice a day. Follow up for suture removal in 7-10 days. Watch for any signs of infection.    Laceration Care, Adult A laceration is a cut that goes through all layers of the skin. The cut also goes into the tissue that is right under the skin. Some cuts heal on their own. Others need to be closed with stitches (sutures), staples, skin adhesive strips, or wound glue. Taking care of your cut lowers your risk of infection and helps your cut to heal better. HOW TO TAKE CARE OF YOUR CUT For stitches or staples:  Keep the wound clean and dry.  If you were given a bandage (dressing), you should change it at least one time per day or as told by your doctor. You should also change it if it gets wet or dirty.  Keep the wound completely dry for the first 24 hours or as told by your doctor. After that time, you may take a shower or a bath. However, make sure that the wound is not soaked in water until after the stitches or staples have been removed.  Clean the wound one time each day or as told by your doctor:  Wash the wound with soap and water.  Rinse the wound with water until all of the soap comes off.  Pat the wound dry with a clean towel. Do not rub the wound.  After you clean the wound, put a thin layer of antibiotic ointment on it as told by your doctor. This ointment:  Helps to prevent infection.  Keeps the bandage from sticking to the wound.  Have your stitches or staples removed as told by your doctor. If your doctor used skin adhesive strips:   Keep the wound clean and dry.  If you were given a bandage, you should change it at least one time per day or as told by your doctor. You should also change it if it gets dirty or wet.  Do not get the skin adhesive strips wet. You can take a shower or a bath, but be careful to keep the wound dry.  If the wound gets wet, pat it dry with a clean towel. Do not rub the wound.  Skin adhesive strips fall  off on their own. You can trim the strips as the wound heals. Do not remove any strips that are still stuck to the wound. They will fall off after a while. If your doctor used wound glue:  Try to keep your wound dry, but you may briefly wet it in the shower or bath. Do not soak the wound in water, such as by swimming.  After you take a shower or a bath, gently pat the wound dry with a clean towel. Do not rub the wound.  Do not do any activities that will make you really sweaty until the skin glue has fallen off on its own.  Do not apply liquid, cream, or ointment medicine to your wound while the skin glue is still on.  If you were given a bandage, you should change it at least one time per day or as told by your doctor. You should also change it if it gets dirty or wet.  If a bandage is placed over the wound, do not let the tape for the bandage touch the skin glue.  Do not pick at the glue. The skin glue usually stays on for 5-10 days. Then, it falls off of the skin. General Instructions  To help prevent scarring, make sure to cover your wound with sunscreen whenever you are outside after stitches are removed, after adhesive strips are removed, or when wound glue stays in place and the wound is healed. Make sure to wear a sunscreen of at least 30 SPF.  Take over-the-counter and prescription medicines only as told by your doctor.  If you were given antibiotic medicine or ointment, take or apply it as told by your doctor. Do not stop using the antibiotic even if your wound is getting better.  Do not scratch or pick at the wound.  Keep all follow-up visits as told by your doctor. This is important.  Check your wound every day for signs of infection. Watch for:  Redness, swelling, or pain.  Fluid, blood, or pus.  Raise (elevate) the injured area above the level of your heart while you are sitting or lying down, if possible. GET HELP IF:  You got a tetanus shot and you have any of  these problems at the injection site:  Swelling.  Very bad pain.  Redness.  Bleeding.  You have a fever.  A wound that was closed breaks open.  You notice a bad smell coming from your wound or your bandage.  You notice something coming out of the wound, such as wood or glass.  Medicine does not help your pain.  You have more redness, swelling, or pain at the site of your wound.  You have fluid, blood, or pus coming from your wound.  You notice a change in the color of your skin near your wound.  You need to change the bandage often because fluid, blood, or pus is coming from the wound.  You start to have a new rash.  You start to have numbness around the wound. GET HELP RIGHT AWAY IF:  You have very bad swelling around the wound.  Your pain suddenly gets worse and is very bad.  You notice painful lumps near the wound or on skin that is anywhere on your body.  You have a red streak going away from your wound.  The wound is on your hand or foot and you cannot move a finger or toe like you usually can.  The wound is on your hand or foot and you notice that your fingers or toes look pale or bluish.   This information is not intended to replace advice given to you by your health care provider. Make sure you discuss any questions you have with your health care provider.   Document Released: 02/02/2008 Document Revised: 12/31/2014 Document Reviewed: 08/12/2014 Elsevier Interactive Patient Education [employer]2016 Elsevier Inc.

## 2016-03-14 NOTE — ED Provider Notes (Signed)
CSN: [id number]     Arrival date & time 03/14/16  0203 History   First MD Initiated Contact with Patient 03/14/16 [phone]     Chief Complaint  Patient presents with  . Extremity Laceration     (Consider location/radiation/quality/duration/timing/severity/associated sxs/prior Treatment) HPI PAT SIRES is a 20 y.o. male who presents to ED with complaint of a laceration. 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 Pt with flap lacerations to the 4th and 5th fingers. Reports some numbness sensation distally. No weakness. No difficulty with moving it. No other complaints. Last tetanus in 2015. Pressure applied for bleeding.   Past Medical History  Diagnosis Date  . Allergy     allergic rhinitis   . Atopic dermatitis   . Keratosis pilaris 2006    normal  . Bronchitis    Past Surgical History  Procedure Laterality Date  . Oral gum graft surgery    . Open reduction internal fixation (orif) metacarpal Right 07/22/2014    Procedure: OPEN REDUCTION INTERNAL FIXATION (ORIF) RIGHT SMALL  METACARPAL FRACTURE ;  Surgeon: Dairl Ponder, MD;  Location: [facility];  Service: Orthopedics;  Laterality: Right;  . Circumcision    . Wisdom tooth extraction    . Dental surgery     Family History  Problem Relation Age of Onset  . Allergies Father     foods, atopic dermatitis  . Hypertension Maternal Grandmother   . Diabetes Maternal Grandfather   . Heart disease Maternal Grandfather   . Cancer Maternal Grandfather   . Heart disease Other     CVA  . Allergies     Social History  Substance Use Topics  . Smoking status: Light Tobacco Smoker    Types: Cigarettes  . Smokeless tobacco: Never Used     Comment: 2-3 packs   . Alcohol Use: No     Comment: drinks once every 2 months     Review of Systems  Constitutional: Negative for fever and chills.  Respiratory: Negative for cough, chest tightness and shortness of breath.    Cardiovascular: Negative for chest pain, palpitations and leg swelling.  Musculoskeletal: Positive for myalgias and arthralgias. Negative for neck pain and neck stiffness.  Skin: Positive for wound. Negative for rash.  Allergic/Immunologic: Negative for immunocompromised state.  Neurological: Negative for dizziness, weakness, light-headedness, numbness and headaches.  All other systems reviewed and are negative.     Allergies  Review of patient's allergies indicates no known allergies.  Home Medications   Prior to Admission medications   Medication Sig Start Date End Date Taking? Authorizing Provider  venlafaxine XR (EFFEXOR XR) 75 MG 24 hr capsule Take 1 capsule (75 mg total) by mouth daily with breakfast. 11/06/15   Sean Hommel, DO   BP 120/70 mmHg  Pulse 114  Temp(Src) 98.9 F (37.2 C) (Oral)  Resp 18  SpO2 98% Physical Exam  Constitutional: He is oriented to person, place, and time. He appears well-developed and well-nourished. No distress.  HENT:  Head: Normocephalic and atraumatic.  Eyes: Conjunctivae are normal.  Neck: Neck supple.  Cardiovascular: Normal rate, regular rhythm and normal heart sounds.   Pulmonary/Chest: Effort normal. No respiratory distress. He has no wheezes. He has no rales.  Musculoskeletal: He exhibits no edema.  Laceration to the middle phalanx of the right fifth finger over the palmar surface. Measuring 3 cm. Laceration is a flap laceration. Another laceration to the middle phalanx of the  right ring finger, also flap, measuring 4 cm. Full range of motion of all the fingers with strength intact at each joint against resistance with flexion and extension. Cap refill is less than 2 seconds distally in all fingers. Sensation is intact to the fingertips.  Neurological: He is alert and oriented to person, place, and time.  Skin: Skin is warm and dry.  Nursing note and vitals reviewed.   ED Course  Procedures (including critical care time) Labs  Review Labs Reviewed - No data to display  Imaging Review Dg Hand Complete Right  03/14/2016  CLINICAL DATA:  Right hand pain after injury. A vase broke in the hand. Lacerations to the distal palm are side. EXAM: RIGHT HAND - COMPLETE 3+ VIEW COMPARISON:  07/12/2014 FINDINGS: Postoperative changes with plate and screw fixation across a healed fracture of the right fifth metacarpal bone. No acute bony abnormalities are demonstrated. No evidence of acute fracture or dislocation. Focal soft tissue defect in the distal aspect of the right fourth finger consistent with laceration. No radiopaque soft tissue foreign bodies are demonstrated. Soft tissue swelling over the metacarpal heads. IMPRESSION: No acute bony abnormalities. No radiopaque soft tissue foreign bodies. Electronically Signed   By: Burman Nieves M.D.   On: 03/14/2016 04:34   I have personally reviewed and evaluated these images and lab results as part of my medical decision-making.   EKG Interpretation None      LACERATION REPAIR Performed by: Lottie Mussel Authorized by: Jaynie Crumble A Consent: Verbal consent obtained. Risks and benefits: risks, benefits and alternatives were discussed Consent given by: patient Patient identity confirmed: provided demographic data Prepped and Draped in normal sterile fashion Wound explored  Laceration Location: right 5th finger  Laceration Length: 3cm  No Foreign Bodies seen or palpated  Anesthesia: local infiltration  Local anesthetic: lidocaine 2% wo epinephrine  Anesthetic total: 2 ml  Irrigation method: syringe Amount of cleaning: standard  Skin closure: prolene 4.0  Number of sutures: 5  Technique: simple interrupted  Patient tolerance: Patient tolerated the procedure well with no immediate complications.  LACERATION REPAIR Performed by: Lottie Mussel Authorized by: Jaynie Crumble A Consent: Verbal consent obtained. Risks and benefits:  risks, benefits and alternatives were discussed Consent given by: patient Patient identity confirmed: provided demographic data Prepped and Draped in normal sterile fashion Wound explored  Laceration Location: right ring finger  Laceration Length: 4cm  No Foreign Bodies seen or palpated  Anesthesia: local infiltration  Local anesthetic: lidocaine 2% wo epinephrine  Anesthetic total: 2 ml  Irrigation method: syringe Amount of cleaning: standard  Skin closure: prolene 4.0  Number of sutures: 8  Technique: simple interrupted  Patient tolerance: Patient tolerated the procedure well with no immediate complications.   MDM   Final diagnoses:  Laceration of right hand, initial encounter   [place] emergency department with laceration to his right hand. X-rays negative. Wounds numbed thoroughly irrigated, palpated with my fingers, no foreign body seen or palpated. Repaired with sutures. Patient's tetanus is up-to-date. Will discharge home with careful wound care and follow-up for sutures removal in 7-10 days. No evidence of tendon injury based on the exam. Lacerations appear to be superficial. followup as needed.   Filed Vitals:   03/14/16 0231 03/14/16 0400 03/14/16 0555  BP: 120/70 135/90 130/87  Pulse: 114 102 62  Temp: 98.9 F (37.2 C)    TempSrc: Oral    Resp: 18  18  SpO2: 98% 100% 100%  Jaynie Crumbleatyana Jessy Calixte, PA-C 03/14/16 [id number]  Rolan BuccoMelanie Belfi, MD 03/14/16 [phone]

## 2016-03-14 NOTE — ED Notes (Signed)
Patient arrives with complaint of right hand lacerations. States a glass vase broke and cut his fingers deeply. Explains that 4th and 5th finger are "avulsed with impaired sensation". Believes movement was intact, but he is not totally sure.

## 2016-03-29 ENCOUNTER — Ambulatory Visit (INDEPENDENT_AMBULATORY_CARE_PROVIDER_SITE_OTHER): Payer: 59 | Admitting: Family Medicine

## 2016-03-29 ENCOUNTER — Encounter: Payer: Self-pay | Admitting: Family Medicine

## 2016-03-29 VITALS — BP 124/72 | HR 70 | Wt 173.0 lb

## 2016-03-29 DIAGNOSIS — Z4802 Encounter for removal of sutures: Secondary | ICD-10-CM | POA: Diagnosis not present

## 2016-03-29 DIAGNOSIS — F528 Other sexual dysfunction not due to a substance or known physiological condition: Secondary | ICD-10-CM | POA: Diagnosis not present

## 2016-03-29 MED ORDER — ESCITALOPRAM OXALATE 10 MG PO TABS: ORAL_TABLET | ORAL | 3 refills | Status: DC

## 2016-03-29 NOTE — Progress Notes (Signed)
CC: Shannon Barber is a 20 y.o. male is here for Suture / Staple Removal   Subjective: HPI:  About 14 days ago he sustained a laceration to the fifth and fourth digit of his right hand. Within an hour he had 13 sutures total placed at a local emergency room. For the past 4 days denies any discharge or pain at the site of the laceration. He has some mild faint tingling in the pads of his fourth and fifth digit but no other motor or sensory disturbances.  He was unable to fill Effexor for second time, the first prescription was only a few blocks but was going to cost him over $100 to get a refill. He wants no further something he can take it similar to this that more affordable. Without this medication he has excessive thoughts about sex that interfere with his quality of life.   Review Of Systems Outlined In HPI  Past Medical History:  Diagnosis Date  . Allergy    allergic rhinitis   . Atopic dermatitis   . Bronchitis   . Keratosis pilaris 2006   normal    Past Surgical History:  Procedure Laterality Date  . CIRCUMCISION    . DENTAL SURGERY    . OPEN REDUCTION INTERNAL FIXATION (ORIF) METACARPAL Right 07/22/2014   Procedure: OPEN REDUCTION INTERNAL FIXATION (ORIF) RIGHT SMALL  METACARPAL FRACTURE ;  Surgeon: Dairl Ponder, MD;  [facility];  Service: Orthopedics;  Laterality: Right;  . oral gum graft surgery    . WISDOM TOOTH EXTRACTION     Family History  Problem Relation Age of Onset  . Allergies Father     foods, atopic dermatitis  . Hypertension Maternal Grandmother   . Diabetes Maternal Grandfather   . Heart disease Maternal Grandfather   . Cancer Maternal Grandfather   . Heart disease Other     CVA  . Allergies      Social History   Social History  . Marital status: Single    Spouse name: N/A  . Number of children: N/A  . Years of education: N/A   Occupational History  . Not on file.   Social History Main Topics  . Smoking  status: Light Tobacco Smoker    Types: Cigarettes  . Smokeless tobacco: Never Used     Comment: 2-3 packs   . Alcohol use No     Comment: drinks once every 2 months   . Drug use:     Types: Marijuana     Comment: occasional  . Sexual activity: Not Currently     Comment: lives with parents, home schooled, enjoys baseball, planes, wants to be a pilot, no smoking in home, no pets, follows lactose-free diet, vegetarian diet, takes B 12 daily.   Other Topics Concern  . Not on file   Social History Narrative   ** Merged History Encounter **         Objective: BP 124/72   Pulse 70   Wt 173 lb (78.5 kg)   BMI 21.62 kg/m   Vital signs reviewed. General: Alert and Oriented, No Acute Distress HEENT: Pupils equal, round, reactive to light. Conjunctivae clear.  External ears unremarkable.  Moist mucous membranes. Lungs: Clear and comfortable work of breathing, speaking in full sentences without accessory muscle use. Cardiac: Regular rate and rhythm.  Neuro: CN II-XII grossly intact, gait normal. Extremities: No peripheral edema.  Strong peripheral pulses.  Mental Status: No depression, anxiety, nor agitation. Logical though  process. Skin: Warm and dry. The laceration on his fifth and fourth digit of the right hand appears clean, dry and intact. All sutures were removed without difficulty or pain.  Assessment & Plan: Shannon Barber was seen today for suture / staple removal.  Diagnoses and all orders for this visit:  Excessive sexual desire in women  Visit for suture removal  Other orders -     escitalopram (LEXAPRO) 10 MG tablet; Half tab by mouth daily, may increase to full tab daily if needed after one week.   Encouraged him to keep a Band-Aid around the laceration sites for the next 5 days to help prevent any accidental avulsion however they appear to be strongly approximated at this time.. Excessive sexual desire: Lexapro should be more affordable and hopefully just as effective at  reducing this.  Signs and symptoms requring emergent/urgent reevaluation were discussed with the patient.  Return if symptoms worsen or fail to improve.

## 2016-06-10 ENCOUNTER — Ambulatory Visit (INDEPENDENT_AMBULATORY_CARE_PROVIDER_SITE_OTHER): Payer: 59 | Admitting: Family Medicine

## 2016-06-10 ENCOUNTER — Encounter: Payer: Self-pay | Admitting: Family Medicine

## 2016-06-10 VITALS — BP 108/68 | HR 88 | Wt 167.0 lb

## 2016-06-10 DIAGNOSIS — Z113 Encounter for screening for infections with a predominantly sexual mode of transmission: Secondary | ICD-10-CM

## 2016-06-10 DIAGNOSIS — G4700 Insomnia, unspecified: Secondary | ICD-10-CM | POA: Diagnosis not present

## 2016-06-10 DIAGNOSIS — F5104 Psychophysiologic insomnia: Secondary | ICD-10-CM | POA: Insufficient documentation

## 2016-06-10 MED ORDER — TRAZODONE HCL 50 MG PO TABS: 25.0000 mg | ORAL_TABLET | Freq: Every evening | ORAL | 3 refills | Status: DC | PRN

## 2016-06-10 NOTE — Progress Notes (Signed)
Shannon Barber is a 20 y.o. male who [facility] today to switch care to me from Dr. Ivan AnchorsHommel.    He is asking for STD testing as he has had unprotected sex with multiple women over the past 6-12 months.  He denies discharge, genital lesions, and genital pain.    Patient is also complaining of insomnia.  This is a chronic problem for him that comes and goes.  He reports difficulty falling asleep and staying asleep.  He has not tried medications before.  Patient admits his sister has bipolar disorder though he is unsure if she was ever formally diagnosed.  He has seen a therapist in the past who told him he has borderline personality disorder.     Past Medical History:  Diagnosis Date  . Allergy    allergic rhinitis   . Atopic dermatitis   . Bronchitis   . Keratosis pilaris 2006   normal   Past Surgical History:  Procedure Laterality Date  . CIRCUMCISION    . DENTAL SURGERY    . OPEN REDUCTION INTERNAL FIXATION (ORIF) METACARPAL Right 07/22/2014   Procedure: OPEN REDUCTION INTERNAL FIXATION (ORIF) RIGHT SMALL  METACARPAL FRACTURE ;  Surgeon: Dairl PonderMatthew Weingold, MD;  Location: [facility];  Service: Orthopedics;  Laterality: Right;  . oral gum graft surgery    . WISDOM TOOTH EXTRACTION     Social History  Substance Use Topics  . Smoking status: Light Tobacco Smoker    Types: Cigarettes  . Smokeless tobacco: Never Used     Comment: 2-3 packs   . Alcohol use No     Comment: drinks once every 2 months    family history includes Allergies in his father; Bipolar disorder in his sister; Cancer in his maternal grandfather; Diabetes in his maternal grandfather; Heart disease in his maternal grandfather and other; Hypertension in his maternal grandmother.  ROS as above: No headache, visual changes, nausea, vomiting, diarrhea, constipation,  dizziness, abdominal pain, skin rash, fevers, chills, night sweats, weight loss, swollen lymph nodes, body aches, joint swelling, muscle aches, chest pain, shortness of breath, visual or auditory hallucinations.    Medications: Current Outpatient Prescriptions  Medication Sig Dispense Refill  . escitalopram (LEXAPRO) 10 MG tablet Half tab by mouth daily, may increase to full tab daily if needed after one week. 30 tablet 3  . traZODone (DESYREL) 50 MG tablet Take 0.5-1 tablets (25-50 mg total) by mouth at bedtime as needed for sleep. 30 tablet 3   No current facility-administered medications for this visit.    No Known Allergies  Health Maintenance Health Maintenance  Topic Date Due  . TETANUS/TDAP  01/21/2013  . INFLUENZA VACCINE  04/30/2017 (Originally 03/30/2016)  . HIV Screening  Completed     Exam:  BP 108/68   Pulse 88   Wt 167 lb (75.8 kg)   BMI 20.87 kg/m  Gen: Well NAD Lungs: Normal work of breathing. CTABL Heart: RRR no MRG Abd: NABS, Soft. Nondistended, Nontender GU: circumscribed penis without lesions, ulcerations, or bumps.  Nontender testicles Psych:  Normal mood and affect   Mood Disorder questionnaire: Answered yes to 6 of 13.      Assessment and Plan: 20 y.o. male presents to transfer care to me and to receive STD testing.  We will check urine (GC/Chlamydia) and blood (HIV, Hep C, Hep B ab and antigen, and RPR)  Patient is also complaining of  insomnia:  There is concern for bipolar disorder given his family history, hypersexuality, and episodes of insomnia.  Plan is to carefully monitor as his symptoms have under control recently.   - Trazadone 50 mg prn - Sleep hygiene counseling - Counseled on the importance of using barrier methods    Orders Placed This Encounter  Procedures  . GC/Chlamydia Probe Amp  . HIV antibody  . Hepatitis C antibody  . Hepatitis B surface antigen  . Hepatitis B surface antibody  . RPR    Discussed warning signs or  symptoms. Please see discharge instructions. Patient expresses understanding.

## 2016-06-10 NOTE — Patient Instructions (Signed)
Thank you for coming in today. Blood work today. Use trazodone at night as needed for sleep. Recheck in 6 months. Return sooner if needed.  Insomnia Insomnia is a sleep disorder that makes it difficult to fall asleep or to stay asleep. Insomnia can cause tiredness (fatigue), low energy, difficulty concentrating, mood swings, and poor performance at work or school.  There are three different ways to classify insomnia:  Difficulty falling asleep.  Difficulty staying asleep.  Waking up too early in the morning. Any type of insomnia can be long-term (chronic) or short-term (acute). Both are common. Short-term insomnia usually lasts for three months or less. Chronic insomnia occurs at least three times a week for longer than three months. CAUSES  Insomnia may be caused by another condition, situation, or substance, such as:  Anxiety.  Certain medicines.  Gastroesophageal reflux disease (GERD) or other gastrointestinal conditions.  Asthma or other breathing conditions.  Restless legs syndrome, sleep apnea, or other sleep disorders.  Chronic pain.  Menopause. This may include hot flashes.  Stroke.  Abuse of alcohol, tobacco, or illegal drugs.  Depression.  Caffeine.   Neurological disorders, such as Alzheimer disease.  An overactive thyroid (hyperthyroidism). The cause of insomnia may not be known. RISK FACTORS Risk factors for insomnia include:  Gender. Women are more commonly affected than men.  Age. Insomnia is more common as you get older.  Stress. This may involve your professional or personal life.  Income. Insomnia is more common in people with lower income.  Lack of exercise.   Irregular work schedule or night shifts.  Traveling between different time zones. SIGNS AND SYMPTOMS If you have insomnia, trouble falling asleep or trouble staying asleep is the main symptom. This may lead to other symptoms, such as:  Feeling fatigued.  Feeling nervous about  going to sleep.  Not feeling rested in the morning.  Having trouble concentrating.  Feeling irritable, anxious, or depressed. TREATMENT  Treatment for insomnia depends on the cause. If your insomnia is caused by an underlying condition, treatment will focus on addressing the condition. Treatment may also include:   Medicines to help you sleep.  Counseling or therapy.  Lifestyle adjustments. HOME CARE INSTRUCTIONS   Take medicines only as directed by your health care provider.  Keep regular sleeping and waking hours. Avoid naps.  Keep a sleep diary to help you and your health care provider figure out what could be causing your insomnia. Include:   When you sleep.  When you wake up during the night.  How well you sleep.   How rested you feel the next day.  Any side effects of medicines you are taking.  What you eat and drink.   Make your bedroom a comfortable place where it is easy to fall asleep:  Put up shades or special blackout curtains to block light from outside.  Use a white noise machine to block noise.  Keep the temperature cool.   Exercise regularly as directed by your health care provider. Avoid exercising right before bedtime.  Use relaxation techniques to manage stress. Ask your health care provider to suggest some techniques that may work well for you. These may include:  Breathing exercises.  Routines to release muscle tension.  Visualizing peaceful scenes.  Cut back on alcohol, caffeinated beverages, and cigarettes, especially close to bedtime. These can disrupt your sleep.  Do not overeat or eat spicy foods right before bedtime. This can lead to digestive discomfort that can make it hard for you  to sleep.  Limit screen use before bedtime. This includes:  Watching TV.  Using your smartphone, tablet, and computer.  Stick to a routine. This can help you fall asleep faster. Try to do a quiet activity, brush your teeth, and go to bed at the  same time each night.  Get out of bed if you are still awake after 15 minutes of trying to sleep. Keep the lights down, but try reading or doing a quiet activity. When you feel sleepy, go back to bed.  Make sure that you drive carefully. Avoid driving if you feel very sleepy.  Keep all follow-up appointments as directed by your health care provider. This is important. SEEK MEDICAL CARE IF:   You are tired throughout the day or have trouble in your daily routine due to sleepiness.  You continue to have sleep problems or your sleep problems get worse. SEEK IMMEDIATE MEDICAL CARE IF:   You have serious thoughts about hurting yourself or someone else.   This information is not intended to replace advice given to you by your health care provider. Make sure you discuss any questions you have with your health care provider.   Document Released: 08/13/2000 Document Revised: 05/07/2015 Document Reviewed: 05/17/2014 Elsevier Interactive Patient Education [employer]

## 2016-06-11 ENCOUNTER — Encounter: Payer: Self-pay | Admitting: Family Medicine

## 2016-06-11 LAB — HEPATITIS B SURFACE ANTIGEN: Hepatitis B Surface Ag: NEGATIVE

## 2016-06-11 LAB — HIV ANTIBODY (ROUTINE TESTING W REFLEX): HIV 1&2 Ab, 4th Generation: NONREACTIVE

## 2016-06-11 LAB — GC/CHLAMYDIA PROBE AMP
CT Probe RNA: NOT DETECTED
GC PROBE AMP APTIMA: NOT DETECTED

## 2016-06-11 LAB — HEPATITIS C ANTIBODY: HCV Ab: NEGATIVE

## 2016-06-11 LAB — HEPATITIS B SURFACE ANTIBODY, QUANTITATIVE: Hepatitis B-Post: <5 m[IU]/mL

## 2016-06-11 LAB — RPR

## 2016-11-18 ENCOUNTER — Ambulatory Visit (INDEPENDENT_AMBULATORY_CARE_PROVIDER_SITE_OTHER): Payer: 59 | Admitting: Family Medicine

## 2016-11-18 ENCOUNTER — Other Ambulatory Visit: Payer: Self-pay | Admitting: Family Medicine

## 2016-11-18 VITALS — BP 115/86 | HR 84 | Temp 98.4°F | Wt 168.0 lb

## 2016-11-18 DIAGNOSIS — N489 Disorder of penis, unspecified: Secondary | ICD-10-CM

## 2016-11-18 MED ORDER — MUPIROCIN 2 % EX OINT: TOPICAL_OINTMENT | CUTANEOUS | 3 refills | Status: DC

## 2016-11-18 NOTE — Patient Instructions (Signed)
Thank you for coming in today. Get blood labs today.  I will get results to you ASAP.  Recheck as needed.  Apply Bactroban ointment twice daily. You can also use Vaseline.  Avoid irritating things like soap.  Impetigo, Adult Impetigo is an infection of the skin. It commonly occurs in young children, but it can also occur in adults. The infection causes itchy blisters and sores that produce brownish-yellow fluid. As the fluid dries, it forms a thick, honey-colored crust. These skin changes usually occur on the face but can also affect other areas of the body. Impetigo usually goes away in 7-10 days with treatment. What are the causes? Impetigo is caused by two types of bacteria. It may be caused by staphylococci or streptococci bacteria. These bacteria cause impetigo when they get under the surface of the skin. This often happens after some damage to the skin, such as damage from:  Cuts, scrapes, or scratches.  Insect bites, especially when you scratch the area of a bite.  Chickenpox or other illnesses that cause open skin sores.  Nail biting or chewing. Impetigo is contagious and can spread easily from one person to another. This may occur through close skin contact or by sharing towels, clothing, or other items with a person who has the infection. What increases the risk? Some things that can increase the risk of getting this infection include:  Playing sports that include skin-to-skin contact with others.  Having a skin condition with open sores.  Having many skin cuts or scrapes.  Living in an area that has high humidity levels.  Having poor hygiene.  Having high levels of staphylococci in your nose. What are the signs or symptoms? Impetigo usually starts out as small blisters, often on the face. The blisters then break open and turn into tiny sores (lesions) with a yellow crust. In some cases, the blisters cause itching or burning. With scratching, irritation, or lack of  treatment, these small lesions may get larger. Scratching can also cause impetigo to spread to other parts of the body. The bacteria can get under the fingernails and spread when you touch another area of your skin. Other possible symptoms include:  Larger blisters.  Pus.  Swollen lymph glands. How is this diagnosed? This condition is usually diagnosed during a physical exam. A skin sample or sample of fluid from a blister may be taken for lab tests that involve growing bacteria (culture test). This can help confirm the diagnosis or help determine the best treatment. How is this treated? Mild impetigo can be treated with prescription antibiotic cream. Oral antibiotic medicine may be used in more severe cases. Medicines for itching may also be used. Follow these instructions at home:  Take medicines only as directed by your health care provider.  To help prevent impetigo from spreading to other body areas:  Keep your fingernails short and clean.  Do not scratch the blisters or sores.  Cover infected areas, if necessary, to keep from scratching.  Gently wash the infected areas with antibiotic soap and water.  Soak crusted areas in warm, soapy water using antibiotic soap.  Gently rub the areas to remove crusts. Do not scrub.  Wash your hands often to avoid spreading this infection.  Stay home until you have used an antibiotic cream for 48 hours (2 days) or an oral antibiotic medicine for 24 hours (1 day). You should only return to work and activities with other people if your skin shows significant improvement. How is this  prevented? To keep the infection from spreading:  Stay home until you have used an antibiotic cream for 48 hours or an oral antibiotic for 24 hours.  Wash your hands often.  Do not engage in skin-to-skin contact with other people while you have still have blisters.  Do not share towels, washcloths, or bedding with others while you have the infection. Contact  a health care provider if:  You develop more blisters or sores despite treatment.  Other family members get sores.  Your skin sores are not improving after 48 hours of treatment.  You have a fever. Get help right away if:  You see spreading redness or swelling of the skin around your sores.  You see red streaks coming from your sores.  You develop a sore throat. This information is not intended to replace advice given to you by your health care provider. Make sure you discuss any questions you have with your health care provider. Document Released: 09/06/2014 Document Revised: 01/22/2016 Document Reviewed: 07/30/2014 Elsevier Interactive Patient Education  2017 [employer]

## 2016-11-18 NOTE — Progress Notes (Signed)
       Shannon Barber is a 21 y.o. male who [facility] today for penile lesion. Patient has a slightly crusted nontender not itchy lesion on his penis. Has been there for about 2 weeks with no injury. He denies fevers or chills and no treatment yet. He denies any penile discharge. He denies any unprotected sex except for with his partner who is currently pregnant and she has been tested recently and does not have any sexually transmitted infections.   Past Medical History:  Diagnosis Date  . Allergy    allergic rhinitis   . Atopic dermatitis   . Bronchitis   . Keratosis pilaris 2006   normal   Past Surgical History:  Procedure Laterality Date  . CIRCUMCISION    . DENTAL SURGERY    . OPEN REDUCTION INTERNAL FIXATION (ORIF) METACARPAL Right 07/22/2014   Procedure: OPEN REDUCTION INTERNAL FIXATION (ORIF) RIGHT SMALL  METACARPAL FRACTURE ;  Surgeon: Dairl PonderMatthew Weingold, MD;  Location: [facility];  Service: Orthopedics;  Laterality: Right;  . oral gum graft surgery    . WISDOM TOOTH EXTRACTION     Social History  Substance Use Topics  . Smoking status: Light Tobacco Smoker    Types: Cigarettes  . Smokeless tobacco: Never Used     Comment: 2-3 packs   . Alcohol use No     Comment: drinks once every 2 months    family history includes Allergies in his father; Bipolar disorder in his sister; Cancer in his maternal grandfather; Diabetes in his maternal grandfather; Heart disease in his maternal grandfather and other; Hypertension in his maternal grandmother.  ROS as above:  Medications: Current Outpatient Prescriptions  Medication Sig Dispense Refill  . mupirocin ointment (BACTROBAN) 2 % Apply to affected area bid for 7 days. 30 g 3   No current facility-administered medications for this visit.    No Known Allergies  Health  Maintenance Health Maintenance  Topic Date Due  . TETANUS/TDAP  01/21/2013  . INFLUENZA VACCINE  04/30/2017 (Originally 03/30/2016)  . HIV Screening  Completed     Exam:  BP 115/86   Pulse 84   Temp 98.4 F (36.9 C) (Oral)   Wt 168 lb (76.2 kg)   BMI 21.00 kg/m  Gen: Well NAD Genitals: No inguinal lymphadenopathy. Penis is circumcised with several small oval crusted lesions without discharge.  No results found for this or any previous visit (from the past 72 hour(s)). No results found.    Assessment and Plan: 21 y.o. male with penile lesions unclear etiology. Potentially impetigo Will treat empirically with mupirocin antibiotic ointment. Obtain STD testing listed below.   Orders Placed This Encounter  Procedures  . HIV antibody  . RPR  . HSV(herpes simplex vrs) 1+2 ab-IgG  . HSV(herpes simplex vrs) 1+2 ab-IgM  . GC Probe amplification, urine   Meds ordered this encounter  Medications  . mupirocin ointment (BACTROBAN) 2 %    Sig: Apply to affected area bid for 7 days.    Dispense:  30 g    Refill:  3     Discussed warning signs or symptoms. Please see discharge instructions. Patient expresses understanding.

## 2016-11-19 LAB — NEISSERIA GONORRHOEAE, PROBE AMP: GC Probe RNA: NOT DETECTED

## 2016-11-19 LAB — HIV ANTIBODY (ROUTINE TESTING W REFLEX): HIV 1&2 Ab, 4th Generation: NONREACTIVE

## 2016-11-19 LAB — HSV(HERPES SIMPLEX VRS) I + II AB-IGG: HSV 1 Glycoprotein G Ab, IgG: <0.9 Index (ref <0.90)

## 2016-11-19 LAB — RPR

## 2016-11-22 LAB — HSV 1/2 AB (IGM), IFA W/RFLX TITER
HSV 1 IGM SCREEN: NEGATIVE
HSV 2 IGM SCREEN: NEGATIVE

## 2018-07-05 ENCOUNTER — Ambulatory Visit (INDEPENDENT_AMBULATORY_CARE_PROVIDER_SITE_OTHER): Payer: No Typology Code available for payment source | Admitting: Family Medicine

## 2018-07-05 VITALS — BP 127/79 | HR 103 | Temp 98.4°F | Wt 188.0 lb

## 2018-07-05 DIAGNOSIS — J029 Acute pharyngitis, unspecified: Secondary | ICD-10-CM | POA: Diagnosis not present

## 2018-07-05 LAB — POCT RAPID STREP A (OFFICE): Rapid Strep A Screen: NEGATIVE

## 2018-07-05 MED ORDER — CEFDINIR 300 MG PO CAPS: 300.0000 mg | ORAL_CAPSULE | Freq: Two times a day (BID) | ORAL | 0 refills | Status: DC

## 2018-07-05 NOTE — Patient Instructions (Signed)
Thank you for coming in today. Continue over the counter medicine for symptom control.  Take omnicef twice dailly for 1 week.   Consider the flu shot later this year.    Tonsillitis Tonsillitis is an infection of the throat that causes the tonsils to become red, tender, and swollen. Tonsils are collections of lymphoid tissue at the back of the throat. Each tonsil has crevices (crypts). Tonsils help fight nose and throat infections and keep infection from spreading to other parts of the body for the first 18 months of life. What are the causes? Sudden (acute) tonsillitis is usually caused by infection with streptococcal bacteria. Long-lasting (chronic) tonsillitis occurs when the crypts of the tonsils become filled with pieces of food and bacteria, which makes it easy for the tonsils to become repeatedly infected. What are the signs or symptoms? Symptoms of tonsillitis include:  A sore throat, with possible difficulty swallowing.  White patches on the tonsils.  Fever.  Tiredness.  New episodes of snoring during sleep, when you did not snore before.  Small, foul-smelling, yellowish-white pieces of material (tonsilloliths) that you occasionally cough up or spit out. The tonsilloliths can also cause you to have bad breath.  How is this diagnosed? Tonsillitis can be diagnosed through a physical exam. Diagnosis can be confirmed with the results of lab tests, including a throat culture. How is this treated? The goals of tonsillitis treatment include the reduction of the severity and duration of symptoms and prevention of associated conditions. Symptoms of tonsillitis can be improved with the use of steroids to reduce the swelling. Tonsillitis caused by bacteria can be treated with antibiotic medicines. Usually, treatment with antibiotic medicines is started before the cause of the tonsillitis is known. However, if it is determined that the cause is not bacterial, antibiotic medicines will not  treat the tonsillitis. If attacks of tonsillitis are severe and frequent, your health care provider may recommend surgery to remove the tonsils (tonsillectomy). Follow these instructions at home:  Rest as much as possible and get plenty of sleep.  Drink plenty of fluids. While the throat is very sore, eat soft foods or liquids, such as sherbet, soups, or instant breakfast drinks.  Eat frozen ice pops.  Gargle with a warm or cold liquid to help soothe the throat. Mix 1/4 teaspoon of salt and 1/4 teaspoon of baking soda in 8 oz of water. Contact a health care provider if:  Large, tender lumps develop in your neck.  A rash develops.  A green, yellow-brown, or bloody substance is coughed up.  You are unable to swallow liquids or food for 24 hours.  You notice that only one of the tonsils is swollen. Get help right away if:  You develop any new symptoms such as vomiting, severe headache, stiff neck, chest pain, or trouble breathing or swallowing.  You have severe throat pain along with drooling or voice changes.  You have severe pain, unrelieved with recommended medications.  You are unable to fully open the mouth.  You develop redness, swelling, or severe pain anywhere in the neck.  You have a fever. This information is not intended to replace advice given to you by your health care provider. Make sure you discuss any questions you have with your health care provider. Document Released: 05/26/2005 Document Revised: 01/22/2016 Document Reviewed: 02/02/2013 Elsevier Interactive Patient Education  2017 [employer].

## 2018-07-05 NOTE — Progress Notes (Signed)
Shannon Barber is a 22 y.o. male who presents to [facility] today for pharyngitis.  Injury notes significant pharyngitis associated with fevers chills body aches and headache.  Symptoms present for a few days and are consistent with previous episodes of tonsillitis or strep throat.  He denies significant cough.  He notes positive strep exposures recently.  He is tried over-the-counter medications which helped a bit.   ROS as above:  Exam:  BP 127/79   Pulse (!) 103   Temp 98.4 F (36.9 C) (Oral)   Wt 188 lb (85.3 kg)   BMI 23.50 kg/m  Wt Readings from Last 5 Encounters:  07/05/18 188 lb (85.3 kg)  11/18/16 168 lb (76.2 kg)  06/10/16 167 lb (75.8 kg)  03/29/16 173 lb (78.5 kg)  10/16/15 176 lb (79.8 kg)    Gen: Well NAD HEENT: EOMI,  MMM posterior pharynx is erythematous with tonsillar hypertrophy bilaterally.  Mild cervical lymphadenopathy present bilaterally. Lungs: Normal work of breathing. CTABL Heart: RRR no MRG heart rate per my check 86 bpm Abd: NABS, Soft. Nondistended, Nontender Exts: Brisk capillary refill, warm and well perfused.   Lab and Radiology Results Results for orders placed or performed in visit on 07/05/18 (from the past 72 hour(s))  POCT rapid strep A     Status: None   Collection Time: 07/05/18 10:56 AM  Result Value Ref Range   Rapid Strep A Screen Negative Negative   No results found.    Assessment and Plan: 22 y.o. male with tonsillitis.  Rapid strep test negative.  Plan to treat empirically with Omnicef.  Continue over-the-counter medications recheck with me as needed if not improving.  Recommend flu vaccine later this year when feeling better.   Orders Placed This Encounter  Procedures  . POCT rapid strep A   Meds ordered this encounter  Medications  . cefdinir (OMNICEF) 300 MG capsule    Sig: Take 1 capsule (300  mg total) by mouth 2 (two) times daily.    Dispense:  14 capsule    Refill:  0     Historical information moved to improve visibility of documentation.  Past Medical History:  Diagnosis Date  . Allergy    allergic rhinitis   . Atopic dermatitis   . Bronchitis   . Keratosis pilaris 2006   normal   Past Surgical History:  Procedure Laterality Date  . CIRCUMCISION    . DENTAL SURGERY    . OPEN REDUCTION INTERNAL FIXATION (ORIF) METACARPAL Right 07/22/2014   Procedure: OPEN REDUCTION INTERNAL FIXATION (ORIF) RIGHT SMALL  METACARPAL FRACTURE ;  Surgeon: Dairl Ponder, MD;  Location: [facility];  Service: Orthopedics;  Laterality: Right;  . oral gum graft surgery    . WISDOM TOOTH EXTRACTION     Social History   Tobacco Use  . Smoking status: Light Tobacco Smoker    Types: Cigarettes  . Smokeless tobacco: Never Used  . Tobacco comment: 2-3 packs   Substance Use Topics  . Alcohol use: No    Comment: drinks once every 2 months    family history includes Allergies in his father and unknown relative; Bipolar disorder in his sister; Cancer in his maternal grandfather; Diabetes in his maternal grandfather; Heart disease in his maternal grandfather and other; Hypertension in his maternal grandmother.  Medications: Current Outpatient Medications  Medication Sig Dispense Refill  . mupirocin ointment (BACTROBAN) 2 % Apply to affected  area bid for 7 days. 30 g 3  . cefdinir (OMNICEF) 300 MG capsule Take 1 capsule (300 mg total) by mouth 2 (two) times daily. 14 capsule 0   No current facility-administered medications for this visit.    No Known Allergies   Discussed warning signs or symptoms. Please see discharge instructions. Patient expresses understanding.

## 2018-10-31 ENCOUNTER — Ambulatory Visit (INDEPENDENT_AMBULATORY_CARE_PROVIDER_SITE_OTHER): Payer: Self-pay | Admitting: Nurse Practitioner

## 2018-10-31 ENCOUNTER — Other Ambulatory Visit: Payer: Self-pay | Admitting: Nurse Practitioner

## 2018-10-31 VITALS — BP 105/70 | HR 87 | Temp 98.5°F | Resp 18 | Wt 187.2 lb

## 2018-10-31 DIAGNOSIS — J22 Unspecified acute lower respiratory infection: Secondary | ICD-10-CM

## 2018-10-31 MED ORDER — DOXYCYCLINE HYCLATE 100 MG PO TABS: 100.0000 mg | ORAL_TABLET | Freq: Two times a day (BID) | ORAL | 0 refills | Status: DC

## 2018-10-31 MED ORDER — METHYLPREDNISOLONE 4 MG PO TBPK: ORAL_TABLET | ORAL | 0 refills | Status: DC

## 2018-10-31 MED ORDER — PROMETHAZINE-DM 6.25-15 MG/5ML PO SYRP: 5.0000 mL | ORAL_SOLUTION | Freq: Four times a day (QID) | ORAL | 0 refills | Status: AC | PRN

## 2018-10-31 MED ORDER — ALBUTEROL SULFATE HFA 108 (90 BASE) MCG/ACT IN AERS: 2.0000 | INHALATION_SPRAY | Freq: Four times a day (QID) | RESPIRATORY_TRACT | 0 refills | Status: DC | PRN

## 2018-10-31 MED ORDER — BENZONATATE 200 MG PO CAPS: 200.0000 mg | ORAL_CAPSULE | Freq: Three times a day (TID) | ORAL | 0 refills | Status: AC | PRN

## 2018-10-31 NOTE — Progress Notes (Addendum)
Subjective:    Patient ID: Shannon Barber, male    DOB: December 02, 1995, 23 y.o.   MRN: [id number]  The patient is a 23 year old male who presents today for complaints of cough for 1 month.  Patient states when his symptoms started started as a sore throat with a dry cough.  Patient states about 2 weeks ago he developed flulike symptoms to include fever, chills, body aches, headache, and continued cough.  Patient states that his symptoms did resolve, but now he continues to have the dry cough along with intermittent nasal congestion.  Today the patient denies fever, chills, wheezing, difficulty breathing, or shortness of breath, but states his cough has persisted.  Patient admits to intermittent chest tightness and mild chest discomfort with cough.  The patient does use e-cigarettes and has been using them since his symptoms have presented.  Patient has not had a flu shot this flu season.   Cough  The current episode started 1 to 4 weeks ago. The problem has been unchanged. The problem occurs every few minutes. The cough is non-productive. Associated symptoms include nasal congestion. Pertinent negatives include no chills, ear congestion, ear pain, fever, postnasal drip, rhinorrhea, sore throat, shortness of breath, weight loss or wheezing. Nothing aggravates the symptoms. Treatments tried: vitamin c. The treatment provided no relief. His past medical history is significant for bronchitis (hx 5 years ago) and environmental allergies. There is no history of asthma.   Past Medical History:  Diagnosis Date  . Allergy    allergic rhinitis   . Atopic dermatitis   . Bronchitis   . Keratosis pilaris 2006   normal   Outpatient Encounter Medications as of 10/31/2018  Medication Sig  . vitamin C (ASCORBIC ACID) 500 MG tablet Take 500 mg by mouth daily.  . cefdinir (OMNICEF) 300 MG capsule Take 1 capsule (300 mg total) by mouth 2 (two) times daily. (Patient not taking: Reported on 10/31/2018)  . mupirocin  ointment (BACTROBAN) 2 % Apply to affected area bid for 7 days. (Patient not taking: Reported on 10/31/2018)   No facility-administered encounter medications on file as of 10/31/2018.     Review of Systems  Constitutional: Positive for fatigue. Negative for chills, fever and weight loss.  HENT: Negative for ear pain, postnasal drip, rhinorrhea and sore throat.   Respiratory: Positive for cough and chest tightness. Negative for shortness of breath and wheezing.        No sputum  Cardiovascular: Negative.   Gastrointestinal: Negative.   Skin: Negative.   Allergic/Immunologic: Positive for environmental allergies.  Neurological: Negative.        Objective: Blood pressure 105/70, pulse 87, temperature 98.5 F (36.9 C), temperature source Oral, resp. rate 18, weight 187 lb 3.2 oz (84.9 kg), SpO2 97 %.   Physical Exam Vitals signs reviewed.  Constitutional:      Comments: Uncomfortable due to persistent cough  HENT:     Head: Normocephalic.     Right Ear: Tympanic membrane, ear canal and external ear normal.     Left Ear: Tympanic membrane, ear canal and external ear normal.     Nose: Mucosal edema, congestion (moderate) and rhinorrhea (clear nasal drainage) present.     Right Turbinates: Enlarged and swollen.     Left Turbinates: Enlarged and swollen.     Right Sinus: No maxillary sinus tenderness or frontal sinus tenderness.     Left Sinus: No maxillary sinus tenderness or frontal sinus tenderness.     Mouth/Throat:  Lips: Pink.     Mouth: Mucous membranes are moist.     Pharynx: Uvula midline. Posterior oropharyngeal erythema present. No pharyngeal swelling, oropharyngeal exudate or uvula swelling.     Tonsils: No tonsillar exudate. Swelling: 0 on the right. 0 on the left.  Eyes:     Pupils: Pupils are equal, round, and reactive to light.  Neck:     Musculoskeletal: Normal range of motion and neck supple.  Cardiovascular:     Rate and Rhythm: Normal rate and regular rhythm.      Pulses: Normal pulses.     Heart sounds: Normal heart sounds.  Pulmonary:     Effort: Pulmonary effort is normal. No respiratory distress.     Breath sounds: Normal breath sounds. No stridor. No wheezing, rhonchi or rales.     Comments: Dry cough is persistent during exam Abdominal:     General: Bowel sounds are normal.     Palpations: Abdomen is soft.     Tenderness: There is no abdominal tenderness.  Lymphadenopathy:     Cervical: No cervical adenopathy.  Skin:    General: Skin is warm and dry.     Capillary Refill: Capillary refill takes less than 2 seconds.  Neurological:     General: No focal deficit present.     Mental Status: He is alert and oriented to person, place, and time.  Psychiatric:        Mood and Affect: Mood normal.        Thought Content: Thought content normal.       Assessment & Plan:   Exam findings, diagnosis etiology and medication use and indications reviewed with patient. Follow- Up and discharge instructions provided. No emergent/urgent issues found on exam.  Based on the patient's clinical presentation, symptoms, duration of symptoms, I am going to go ahead and begin the patient on an antibiotic. I am starting Doxycycline which will provide good coverage for bacteria of the lung. There was no wheezing on exam and no focal areas of adventitious lung sounds. However, I do suspect secondary infection as he informed he did have possible "influenza" about 2 weeks ago. I am also placing the patient on a methylprednisolone taper as I feel this will provide better symptom relief for the patient's cough due to its severity and persistence. I am aware that the steroid may result in immunosuppression; however, I feel the benefit will outweigh the risk at this time.  Symptomatic treatment will also be provided to help with nighttime cough, daytime cough and for his chest tightness.   Suspicion of a bacterial lung infection due to the patient's persistent symptom of cough  with no resolution or improvement.  Patient has no underlying co-morbidities that are of concern.  Patient is well-appearing, although uncomfortable from persistent cough, vitals are stable. Patient education was provided. Patient verbalized understanding of information provided and agrees with plan of care (POC), all questions answered. The patient is advised to call or return to clinic if condition does not see an improvement in symptoms, or to seek the care of the closest emergency department if condition worsens with the above plan.   1. Lower respiratory infection  - doxycycline (VIBRA-TABS) 100 MG tablet; Take 1 tablet (100 mg total) by mouth 2 (two) times daily.  Dispense: 20 tablet; Refill: 0 - albuterol (PROVENTIL HFA;VENTOLIN HFA) 108 (90 Base) MCG/ACT inhaler; Inhale 2 puffs into the lungs every 6 (six) hours as needed for up to 10 days.  Dispense: 1 Inhaler;  Refill: 0 - promethazine-dextromethorphan (PROMETHAZINE-DM) 6.25-15 MG/5ML syrup; Take 5 mLs by mouth 4 (four) times daily as needed for up to 7 days.  Dispense: 140 mL; Refill: 0 - methylPREDNISolone (MEDROL DOSEPAK) 4 MG TBPK tablet; Take as directed.  Dispense: 21 tablet; Refill: 0 - benzonatate (TESSALON) 200 MG capsule; Take 1 capsule (200 mg total) by mouth 3 (three) times daily as needed for up to 10 days.  Dispense: 30 capsule; Refill: 0 -Take medication as prescribed. -Ibuprofen or Tylenol for pain, fever, or general discomfort. -Increase fluids. -Sleep elevated on at least 2 pillows at bedtime to help with cough. -Use a humidifier or vaporizer when at home and during sleep to help with cough. -May use a teaspoon of honey or over-the-counter cough drops to help with cough. -You should stop using the e-cigarettes until your symptoms improve. -Follow-up with your PCP if your symptoms do not improve within the next 3-5 days.  You may need a chest x-ray at that time.

## 2018-10-31 NOTE — Patient Instructions (Signed)
Cough, Adult -Take medication as prescribed. -Ibuprofen or Tylenol for pain, fever, or general discomfort. -Increase fluids. -Sleep elevated on at least 2 pillows at bedtime to help with cough. -Use a humidifier or vaporizer when at home and during sleep to help with cough. -May use a teaspoon of honey or over-the-counter cough drops to help with cough. -You should stop using the e-cigarettes until your symptoms improve. -Follow-up with your PCP if your symptoms do not improve within the next 3-5 days.  You may need a chest x-ray at that time.  Coughing is a reflex that clears your throat and your airways. Coughing helps to heal and protect your lungs. It is normal to cough occasionally, but a cough that happens with other symptoms or lasts a long time may be a sign of a condition that needs treatment. A cough may last only 2-3 weeks (acute), or it may last longer than 8 weeks (chronic). What are the causes? Coughing is commonly caused by:  Breathing in substances that irritate your lungs.  A viral or bacterial respiratory infection.  Allergies.  Asthma.  Postnasal drip.  Smoking.  Acid backing up from the stomach into the esophagus (gastroesophageal reflux).  Certain medicines.  Chronic lung problems, including COPD (or rarely, lung cancer).  Other medical conditions such as heart failure. Follow these instructions at home: Pay attention to any changes in your symptoms. Take these actions to help with your discomfort:  Take medicines only as told by your health care provider. ? If you were prescribed an antibiotic medicine, take it as told by your health care provider. Do not stop taking the antibiotic even if you start to feel better. ? Talk with your health care provider before you take a cough suppressant medicine.  Drink enough fluid to keep your urine clear or pale yellow.  If the air is dry, use a cold steam vaporizer or humidifier in your bedroom or your home to help  loosen secretions.  Avoid anything that causes you to cough at work or at home.  If your cough is worse at night, try sleeping in a semi-upright position.  Avoid cigarette smoke. If you smoke, quit smoking. If you need help quitting, ask your health care provider.  Avoid caffeine.  Avoid alcohol.  Rest as needed. Contact a health care provider if:  You have new symptoms.  You cough up pus.  Your cough does not get better after 2-3 weeks, or your cough gets worse.  You cannot control your cough with suppressant medicines and you are losing sleep.  You develop pain that is getting worse or pain that is not controlled with pain medicines.  You have a fever.  You have unexplained weight loss.  You have night sweats. Get help right away if:  You cough up blood.  You have difficulty breathing.  Your heartbeat is very fast. This information is not intended to replace advice given to you by your health care provider. Make sure you discuss any questions you have with your health care provider. Document Released: 02/12/2011 Document Revised: 01/22/2016 Document Reviewed: 10/23/2014 Elsevier Interactive Patient Education  2019 [employer].

## 2018-11-02 ENCOUNTER — Telehealth: Payer: Self-pay

## 2018-11-02 NOTE — Telephone Encounter (Signed)
Called and spoke with pt and he states he is not doing a lot better. He is taking the course of antibiotics and he will follow up with his PCP if not improved by tomorrow.

## 2018-12-18 ENCOUNTER — Ambulatory Visit (INDEPENDENT_AMBULATORY_CARE_PROVIDER_SITE_OTHER): Payer: No Typology Code available for payment source

## 2018-12-18 ENCOUNTER — Encounter: Payer: Self-pay | Admitting: Family Medicine

## 2018-12-18 ENCOUNTER — Other Ambulatory Visit: Payer: Self-pay

## 2018-12-18 ENCOUNTER — Ambulatory Visit (INDEPENDENT_AMBULATORY_CARE_PROVIDER_SITE_OTHER): Payer: No Typology Code available for payment source | Admitting: Family Medicine

## 2018-12-18 VITALS — BP 127/74 | HR 109 | Ht 75.0 in | Wt 187.0 lb

## 2018-12-18 DIAGNOSIS — M79671 Pain in right foot: Secondary | ICD-10-CM

## 2018-12-18 DIAGNOSIS — M25571 Pain in right ankle and joints of right foot: Secondary | ICD-10-CM

## 2018-12-18 NOTE — Patient Instructions (Addendum)
Thank you for coming in today. Use the boot and do not put weight on the boot for 1-2 weeks.  Recheck in clinic in 1-2 weeks.  Return sooner if needed.  Ibuprofen/aleve and tylenol are ok.   OK to continue to work from home.    Foot Sprain  A foot sprain is an injury to one of the strong bands of tissue (ligaments) that connect and support the bones in your feet. The ligament can be stretched too much and tear. A tear can be either partial or complete. The severity of the sprain depends on how much of the ligament was damaged or torn. What are the causes? This condition is usually caused by suddenly twisting or pivoting your foot. What increases the risk? This injury is more likely to occur in people who:  Play a sport, such as basketball or football.  Exercise or play a sport without warming up.  Start a new workout or sport.  Suddenly increase how long or hard they exercise or play a sport.  Have previously injured their foot or ankle. What are the signs or symptoms? Symptoms of this condition start soon after an injury and include:  Pain, especially in the arch of the foot.  Bruising.  Swelling.  Inability to walk or use the foot to support body weight. How is this diagnosed? This condition is diagnosed with a medical history and physical exam. You may also have imaging tests, such as:  X-rays to make sure there are no broken bones (fractures).  An MRI to see if the ligament is torn. How is this treated? Treatment for this condition depends on the severity of the sprain.  Mild sprains can be treated with: ? Rest, ice, compression, and elevation (RICE). ? Keeping your foot in a fixed position (immobilization) for a period of time. This is done if your ligament is overstretched or partially torn. Your health care provider will apply a bandage, splint, or walking boot to keep your foot from moving until it heals. ? Using crutches or a scooter for a few weeks to avoid  putting weight on your foot while it is healing.  Major sprains can be treated with: ? Surgery. This is done if your ligament is fully torn and a procedure is needed to reconnect it to the bone. ? A cast or splint. This will be needed after surgery. A cast or splint will need to stay on your foot while it heals.  In both types of sprains, you may need to exercise or have physical therapy to strengthen your foot. Follow these instructions at home: If you have a bandage, splint, or boot:  Wear the bandage, splint, or boot as told by your health care provider. Remove only as told by your health care provider.  Loosen the bandage, splint, or boot if your toes tingle, become numb, or turn cold and blue.  Keep the bandage, splint, or boot clean and dry. If you have a cast:  Do not stick anything inside the cast to scratch your skin. Doing that increases your risk for infection.  Check the skin around the cast every day. Tell your health care provider about any concerns.  You may put lotion on dry skin around the edges of the cast. Do not put lotion on the skin underneath the cast.  Keep the cast clean and dry. Bathing  Do not take baths, swim, or use a hot tub until your health care provider approves. Ask your health care  provider if you may take showers. You may only be allowed to take sponge baths.  If the bandage, splint, boot, or cast is not waterproof: ? Do not let it get wet. ? Cover it with a watertight covering when you take a shower. Managing pain, stiffness, and swelling   If directed, put ice on the injured area: ? If you have a removable splint, boot, or immobilizer, remove it as told by your health care provider. ? Put ice in a plastic bag. ? Place a towel between your skin and the bag. ? Leave the ice on for 20 minutes, 2-3 times per day.  Move your toes often to avoid stiffness and to lessen swelling.  Raise (elevate) the injured area above the level of your heart  while you are sitting or lying down. Driving  Do not drive or operate heavy machinery while taking pain medicine.  Ask your health care provider when it is safe to drive if you have a bandage, splint, or walking boot on your foot. Activity  Do not use the injured foot to support your body weight until your health care provider says that you can. Use crutches or other supportive devices as directed by your health care provider.  Ask your health care provider what activities are safe for you. Do any exercise or physical therapy as directed.  Gradually increase how much and how far you walk until your health care provider says it is safe to return to full activity. General instructions  If you have a cast, do not put pressure on any part of it until it is fully hardened. This may take several hours.  Take over-the-counter and prescription medicines only as told by your health care provider.  When you can walk without pain, wear supportive shoes that have stiff soles. Do not wear flip-flops, and do not walk barefoot.  Keep all follow-up visits as told by your health care provider. This is important. Contact a health care provider if:  Your pain is not controlled with medicine.  Your bruising or swelling gets worse or does not get better with treatment.  Your splint, boot, or cast is damaged. Get help right away if:  You develop severe numbness or tingling in your foot.  Your foot turns blue, white, or gray, and it feels cold. Summary  A foot sprain is an injury to one of the strong bands of tissue (ligaments) that connect and support the bones in your feet.  Your health care provider may recommend a splint or boot for your foot to support it while it heals. In some cases, surgery may be needed.  Physical therapy can help keep your other muscles strong until your foot gets better. This information is not intended to replace advice given to you by your health care provider. Make  sure you discuss any questions you have with your health care provider. Document Released: 02/05/2002 Document Revised: 08/20/2017 Document Reviewed: 08/20/2017 Elsevier Interactive Patient Education  2019 [employer]  Lisfranc Midfoot Injury  A Lisfranc midfoot injury is a break (fracture), separation (dislocation), or sprain involving the bones and joints in the middle of your foot. Your midfoot is made up of a cluster of small bones (tarsals) that attach to the long bones going to your toes (metatarsals). Strong bands of tissue (ligaments) attach the bones of your midfoot to each other. A Lisfranc midfoot injury can include:  Ligament damage or tears.  Bone fractures.  Dislocations.  A combination of these injuries.  This type of injury can cause foot pain and instability. The condition can range from mild to severe. What are the causes? This condition may be caused by:  An injury that twists your foot forcefully.  Tripping or falling over your foot.  Falling from a height.  A hard, direct hit or a crushing injury to your foot. What increases the risk? This condition is more likely to develop in athletes who participate in:  Contact sports, especially while wearing cleats, like in football.  Activities in which your foot is loaded in a pointed position like dance and gymnastics. What are the signs or symptoms? Symptoms of this condition include:  Foot pain that gets worse with standing or walking.  Foot swelling.  Bruising on the top or bottom of the foot.  Pain when pressing on the top or bottom of the foot (tenderness).  Seeing or feeling a new bump on the top of your foot. How is this diagnosed? This condition is diagnosed based on your symptoms and medical history, especially if you recently had an injury. Your health care provider will also do a physical exam to check for bruising under your foot andmove your toes to test for pain. You may be asked if you can  stand up on tiptoe. You may also have imaging studies done, including:  X-rays.  CT scans.  MRI. How is this treated? The first treatments for ligament injuries that do not cause instability or dislocation of the midfoot are usually nonsurgical. These may include:  Using a boot or cast to keep your foot still and protect it while it heals (immobilization). You may need to wear this for at least 6 weeks or as told by your health care provider.  Using crutches to keep weight off your foot.  Physical therapy to improve motion and strength.  Medicine for pain. Surgery is the treatment for fractures, dislocations, and unstable ligament injuries. This may include ligament repair, joint fusion, or a procedure to stabilize the fracture using screws or plates. After surgery, you will have to wear a cast and eventually have physical therapy. Follow these instructions at home: If you have a boot:  Wear the boot as told by your health care provider. Remove it only as told by your health care provider.  Loosen the boot if your toes tingle, become numb, or turn cold and blue.  Do not let your boot get wet if it is not waterproof.  Keep the boot clean. If you have a cast:  Do not stick anything inside the cast to scratch your skin. Doing that increases your risk of infection.  Check the skin around the cast every day. Tell your health care provider about any concerns.  You may put lotion on dry skin around the edges of the cast. Do not put lotion on the skin underneath the cast.  Do not let your cast get wet if it is not waterproof.  Keep the cast clean. Bathing  Do not take baths, swim, or use a hot tub until your health care provider approves. Ask your health care provider if you can take showers. You may only be allowed to take sponge baths for bathing.  If your boot or cast is not waterproof, protect it with a watertight covering when you take a bath or a shower. Managing pain,  stiffness, and swelling  If directed, apply ice to the injured area. ? If you have a removable boot, remove it as told by your health  care provider. ? Put ice in a plastic bag. ? Place a towel between the bag and your skin or between the bag and your cast. ? Leave the ice on for 20 minutes, 2-3 times a day.  Move your toes often to avoid stiffness and to lessen swelling.  Raise (elevate) the injured area above the level of your heart while you are sitting or lying down. Driving  Do not drive or operate heavy machinery while taking prescription pain medicine.  Ask your health care provider when it is safe to drive if you have a boot or cast on your foot. Activity  Return to your normal activities as told by your health care provider. Ask your health care provider what activities are safe for you.  Do exercises as told by your health care provider. Safety  Do not use the injured limb to support your body weight until your health care provider says that you can. Use your crutches as told by your health care provider. General instructions  Do not put pressure on any part of the cast until it is fully hardened. This may take several hours.  Do not use any tobacco products, such as cigarettes, chewing tobacco, and e-cigarettes. Tobacco can delay healing. If you need help quitting, ask your health care provider.  Take over-the-counter and prescription medicines only as told by your health care provider.  Keep all follow-up visits as told by your health care provider. This is important. How is this prevented?  Make sure to use equipment that fits you.  Use footwear that is appropriate for your athletic activity and the playing surface.  Be safe and responsible while being active to avoid falls. Contact a health care provider if:  Your pain medicine and rest are not helping. Get help right away if:  Your foot becomes very painful or numb.  Your toes become very pale or turn  blue. This information is not intended to replace advice given to you by your health care provider. Make sure you discuss any questions you have with your health care provider. Document Released: 08/16/2005 Document Revised: 04/18/2018 Document Reviewed: 04/30/2015 Elsevier Interactive Patient Education  2019 [employer].
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
## 2018-12-25 ENCOUNTER — Encounter: Payer: Self-pay | Admitting: Family Medicine

## 2018-12-25 ENCOUNTER — Ambulatory Visit (INDEPENDENT_AMBULATORY_CARE_PROVIDER_SITE_OTHER): Payer: No Typology Code available for payment source | Admitting: Family Medicine

## 2018-12-25 VITALS — BP 119/76 | HR 102 | Temp 97.9°F | Wt 187.0 lb

## 2018-12-25 DIAGNOSIS — S93491D Sprain of other ligament of right ankle, subsequent encounter: Secondary | ICD-10-CM

## 2018-12-25 DIAGNOSIS — M79671 Pain in right foot: Secondary | ICD-10-CM | POA: Insufficient documentation

## 2018-12-25 NOTE — Patient Instructions (Signed)
Thank you for coming in today.  Transition to ankle brace.  Do the exercises below.   Send me an update in 1 month.  Use the brace with normal activity for 1 month.  Use the brace with heavy activity for 3 months.   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    Ankle Sprain, Phase I Rehab Ask your health care provider which exercises are safe for you. Do exercises exactly as told by your health care provider and adjust them as directed. It is normal to feel mild stretching, pulling, tightness, or discomfort as you do these exercises, but you should stop right away if you feel sudden pain or your pain gets worse.Do not begin these exercises until told by your health care provider. Stretching and range of motion exercises These exercises warm up your muscles and joints and improve the movement and flexibility of your lower leg and ankle. These exercises also help to relieve pain and stiffness. Exercise A: Gastroc and soleus stretch  1. Sit on the floor with your left / right leg extended. 2. Loop a belt or towel around the ball of your left / right foot. The ball of your foot is on the walking surface, right under your toes. 3. Keep your left / right ankle and foot relaxed and keep your knee straight while you use the belt or towel to pull your foot toward you. You should feel a gentle stretch behind your calf or knee. 4. Hold this position for __________ seconds, then release to the starting position. Repeat the exercise with your knee bent. You can put a pillow or a rolled bath towel under your knee to support it. You should feel a stretch deep in your calf or at your Achilles tendon. Repeat each stretch __________ times. Complete these stretches __________ times a day. Exercise B: Ankle alphabet  1. Sit with your left / right leg supported at the lower leg. ? Do not rest your foot on anything. ? Make sure your foot has room to move freely. 2. Think of  your left / right foot as a paintbrush, and move your foot to trace each letter of the alphabet in the air. Keep your hip and knee still while you trace. Make the letters as large as you can without feeling discomfort. 3. Trace every letter from A to Z. Repeat __________ times. Complete this exercise __________ times a day. Strengthening exercises These exercises build strength and endurance in your ankle and lower leg. Endurance is the ability to use your muscles for a long time, even after they get tired. Exercise C: Dorsiflexors  1. Secure a rubber exercise band or tube to an object, such as a table leg, that will stay still when the band is pulled. Secure the other end around your left / right foot. 2. Sit on the floor facing the object, with your left / right leg extended. The band or tube should be slightly tense when your foot is relaxed. 3. Slowly bring your foot toward you, pulling the band tighter. 4. Hold this position for __________ seconds. 5. Slowly return your foot to the starting position. Repeat __________ times. Complete this exercise __________ times a day. Exercise D: Plantar flexors  1. Sit on the floor with your left / right leg extended. 2. Loop a rubber exercise tube or band around the ball of your left / right foot. The ball of your foot is on the walking surface, right  under your toes. ? Hold the ends of the band or tube in your hands. ? The band or tube should be slightly tense when your foot is relaxed. 3. Slowly point your foot and toes downward, pushing them away from you. 4. Hold this position for __________ seconds. 5. Slowly return your foot to the starting position. Repeat __________ times. Complete this exercise __________ times a day. Exercise E: Evertors 1. Sit on the floor with your legs straight out in front of you. 2. Loop a rubber exercise band or tube around the ball of your left / right foot. The ball of your foot is on the walking surface, right  under your toes. ? Hold the ends of the band in your hands, or secure the band to a stable object. ? The band or tube should be slightly tense when your foot is relaxed. 3. Slowly push your foot outward, away from your other leg. 4. Hold this position for __________ seconds. 5. Slowly return your foot to the starting position. Repeat __________ times. Complete this exercise __________ times a day. This information is not intended to replace advice given to you by your health care provider. Make sure you discuss any questions you have with your health care provider. Document Released: 03/17/2005 Document Revised: 01/31/2017 Document Reviewed: 06/30/2015 Elsevier Interactive Patient Education  2019 [employer]   Ankle Sprain, Phase II Rehab Ask your health care provider which exercises are safe for you. Do exercises exactly as told by your health care provider and adjust them as directed. It is normal to feel mild stretching, pulling, tightness, or discomfort as you do these exercises, but you should stop right away if you feel sudden pain or your pain gets worse.Do not begin these exercises until told by your health care provider. Stretching and range of motion exercises These exercises warm up your muscles and joints and improve the movement and flexibility of your lower leg and ankle. These exercises also help to relieve pain and stiffness. Exercise A: Gastroc stretch, standing  1. Stand with your hands against a wall. 2. Extend your left / right leg behind you, and bend your front knee slightly. Your heels should be on the floor. 3. Keeping your heels on the floor and your back knee straight, shift your weight toward the wall. You should feel a gentle stretch in the back of your lower leg (calf). 4. Hold this position for __________ seconds. Repeat __________ times. Complete this exercise __________ times a day. Exercise B: Soleus stretch, standing 1. Stand with your hands against a  wall. 2. Extend your left / right leg behind you, and bend your front knee slightly. Both of your heels should be on the floor. 3. Keeping your heels on the floor, bend your back knee and shift your weight slightly over your back leg. You should feel a gentle stretch deep in your calf. 4. Hold this position for __________ seconds. Repeat __________ times. Complete this exercise __________ times a day. Strengthening exercises These exercises build strength and endurance in your lower leg. Endurance is the ability to use your muscles for a long time, even after they get tired. Exercise C: Heel walking (dorsiflexion)  Walk on your heels for __________ seconds or ___________ ft. Keep your toes as high as possible. Repeat __________ times. Complete this exercise __________ times a day. Balance exercises These exercises improve your balance and the reaction and control of your ankle to help improve stability. Exercise D: Multi-angle lunge 1. Stand  with your feet together. 2. Take a step forward with your left / right leg, and shift your weight onto that leg. Your back heel will come off the floor, and your back toes will stay in place. 3. Push off your front leg to return your front foot to the starting position next to your other foot. 4. Repeat to the side, to the back, and any other directions as told by your health care provider. Repeat in each direction __________ times. Complete this exercise __________ times a day. Exercise E: Single leg stand 1. Without shoes, stand near a railing or in a door frame. Hold onto the railing or door frame as needed. 2. Stand on your left / right foot. Keep your big toe down on the floor and try to keep your arch lifted. 3. Hold this position for __________ seconds. Repeat __________ times. Complete this exercise __________ times a day. If this exercise is too easy, you can try it with your eyes closed or while standing on a pillow. Exercise F:  Inversion/eversion  You will need a balance board for this exercise. Ask your health care provider where you can get a balance board or how you can make one. 1. Stand on a non-carpeted surface near a countertop or wall. 2. Step onto the balance board so your feet are hip-width apart. 3. Keep your feet in place and keep your upper body and hips steady. Using only your feet and ankles to move the board, do one or both of the following exercises as told by your health care provider: ? Tip the board side to side as far as you can, alternating between tipping to the left and tipping to the right. If you can, tip the board so it silently taps the floor. Do not let the board forcefully hit the floor. From time to time, pause to hold a steady position. ? Tip the board side to side so the board does not hit the floor at all. From time to time, pause to hold a steady position. Repeat the movement for each exercise __________ times. Complete each exercise __________ times a day. Exercise G: Plantar flexion/dorsiflexion  You will need a balance board for this exercise. Ask your health care provider where you can get a balance board or how you can make one. 1. Stand on a non-carpeted surface near a countertop or wall. 2. Step onto the balance board so your feet are hip-width apart. 3. Keep your feet in place and keep your upper body and hips steady. Using only your feet and ankles to move the board, do one or both of the following exercises as told by your health care provider: ? Tip the board forward and backward so the board silently taps the floor. Do not let the board forcefully hit the floor. From time to time, pause to hold a steady position. ? Tip the board forward and backward so the board does not hit the floor at all. From time to time, pause to hold a steady position. Repeat the movement for each exercise __________ times. Complete each exercise __________ times a day. This information is not  intended to replace advice given to you by your health care provider. Make sure you discuss any questions you have with your health care provider. Document Released: 12/06/2005 Document Revised: 01/31/2017 Document Reviewed: 06/30/2015 Elsevier Interactive Patient Education  2019 [employer].
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
## 2020-10-29 ENCOUNTER — Ambulatory Visit (INDEPENDENT_AMBULATORY_CARE_PROVIDER_SITE_OTHER): Payer: 59 | Admitting: Medical-Surgical

## 2020-10-29 ENCOUNTER — Other Ambulatory Visit: Payer: Self-pay

## 2020-10-29 ENCOUNTER — Encounter: Payer: Self-pay | Admitting: Medical-Surgical

## 2020-10-29 VITALS — BP 111/71 | HR 101 | Temp 98.8°F | Ht 75.0 in | Wt 191.0 lb

## 2020-10-29 DIAGNOSIS — R319 Hematuria, unspecified: Secondary | ICD-10-CM | POA: Diagnosis not present

## 2020-10-29 DIAGNOSIS — Z7251 High risk heterosexual behavior: Secondary | ICD-10-CM

## 2020-10-29 DIAGNOSIS — Z7689 Persons encountering health services in other specified circumstances: Secondary | ICD-10-CM

## 2020-10-29 DIAGNOSIS — R21 Rash and other nonspecific skin eruption: Secondary | ICD-10-CM

## 2020-10-29 LAB — POCT URINALYSIS DIP (CLINITEK)
Bilirubin, UA: NEGATIVE
Glucose, UA: NEGATIVE mg/dL
Leukocytes, UA: NEGATIVE
Nitrite, UA: NEGATIVE
POC PROTEIN,UA: NEGATIVE
Spec Grav, UA: >=1.03 — AB (ref 1.010–1.025)
Urobilinogen, UA: 1 E.U./dL
pH, UA: 6.5 (ref 5.0–8.0)

## 2020-10-29 MED ORDER — HYDROCORTISONE 1 % EX OINT: 1.0000 "application " | TOPICAL_OINTMENT | Freq: Two times a day (BID) | CUTANEOUS | 0 refills | Status: DC

## 2020-10-29 MED ORDER — CLOTRIMAZOLE 1 % EX OINT: 1.0000 "application " | TOPICAL_OINTMENT | Freq: Two times a day (BID) | CUTANEOUS | 0 refills | Status: DC

## 2020-10-29 NOTE — Progress Notes (Signed)
Subjective:    CC: establish care  HPI: Pleasant 25 year old male presenting today to establish care with a new PCP after Dr. Denyse Amass left our practice. He would like to have STD testing today. He has a new relationship and they have both postponed sexual activity until they are both tested in order to make sure they are being as safe as possible. Notes that he was recently in [place], [place] where he connected with an old high school crush. She was his only partner but he found out that she had multiple other partners. They practices unsafe sex and he has been worried. Shortly after this, he developed a rash on the shaft of his penis. The rash comes and goes but has not completely resolved for more than a short period of time. The rash is not itchy and he has not seen any discharge. He has notes some very small "blisters" at times but they are not painful and do not burn or itch. History of eczema.   I reviewed the past medical history, family history, social history, surgical history, and allergies today and no changes were needed.  Please see the problem list section below in epic for further details.  Past Medical History: Past Medical History:  Diagnosis Date  . Allergy    allergic rhinitis   . Atopic dermatitis   . Bronchitis   . Keratosis pilaris 2006   normal   Past Surgical History: Past Surgical History:  Procedure Laterality Date  . CIRCUMCISION    . DENTAL SURGERY    . OPEN REDUCTION INTERNAL FIXATION (ORIF) METACARPAL Right 07/22/2014   Procedure: OPEN REDUCTION INTERNAL FIXATION (ORIF) RIGHT SMALL  METACARPAL FRACTURE ;  Surgeon: Dairl Ponder, MD;  Location: [facility];  Service: Orthopedics;  Laterality: Right;  . oral gum graft surgery    . WISDOM TOOTH EXTRACTION     Social History: Social History   Socioeconomic History  . Marital status: Single    Spouse name: Not on file  . Number of children: Not on file  . Years of education: Not on file  .  Highest education level: Not on file  Occupational History  . Not on file  Tobacco Use  . Smoking status: Light Tobacco Smoker    Types: Cigarettes  . Smokeless tobacco: Never Used  . Tobacco comment: 2-3 packs   Substance and Sexual Activity  . Alcohol use: No    Comment: drinks once every 2 months   . Drug use: Yes    Types: Marijuana    Comment: occasional  . Sexual activity: Not Currently    Comment: lives with parents, home schooled, enjoys baseball, planes, wants to be a pilot, no smoking in home, no pets, follows lactose-free diet, vegetarian diet, takes B 12 daily.  Other Topics Concern  . Not on file  Social History Narrative   ** Merged History Encounter **       Social Determinants of Health   Financial Resource Strain: Not on file  Food Insecurity: Not on file  Transportation Needs: Not on file  Physical Activity: Not on file  Stress: Not on file  Social Connections: Not on file   Family History: Family History  Problem Relation Age of Onset  . Heart disease Other        CVA  . Allergies Father        foods, atopic dermatitis  . Hypertension Maternal Grandmother   . Diabetes Maternal Grandfather   . Heart disease  Maternal Grandfather   . Cancer Maternal Grandfather   . Allergies Other   . Bipolar disorder Sister    Allergies: No Known Allergies Medications: See med rec.  Review of Systems: See HPI for pertinent positives and negatives.   Objective:    General: Well Developed, well nourished, and in no acute distress.  Neuro: Alert and oriented x3.  HEENT: Normocephalic, atraumatic.  Skin: Warm and dry. Mildly erythematous plaque-like rash noted to the superior aspect of the middle section of the shaft of the penis. Skin appears to be dry without scaling, flaking, or discharge. No vesicles or pustules noted.  Cardiac: Regular rate and rhythm, no murmurs rubs or gallops, no lower extremity edema.  Respiratory: Clear to auscultation bilaterally. Not  using accessory muscles, speaking in full sentences.  Impression and Recommendations:    1. Encounter to establish care Reviewed available information and discussed care concerns with patient.   2. High risk sexual behavior, unspecified type POCT UA + ketones, trace-lysed blood, elevated SG but - for nitrites, leukocytes, and protein. Checking urine for gonorrhea, chlamydia, and trichomonas. Checking RPR, HIV, Hepatitis C, and Hepatitis B. Recommend condom use with all sexual encounters. - C. trachomatis/N. gonorrhoeae RNA - POCT URINALYSIS DIP (CLINITEK) - RPR - HIV Antibody (routine testing w rflx) - Hepatitis B surface antigen - Hepatitis C Antibody - Trichomonas vaginalis RNA, Ql,Males  3. Rash Rash does not appear consistent with candidiasis, herpes, or syphilis. Appearance similar to eczematous rashes also present on the posterior neck. Per UpToDate recommendations, will treat with topical clotrimazole 1% in case presentation is atypical for yeast. If no improvement in 7 days, switch to hydrocortisone 1% ointment BID x 7 days.   4. Hematuria, unspecified type Sending urine for culture.  - Urine Culture  Return if symptoms worsen or fail to improve. ___________________________________________ Thayer Ohm, DNP, APRN, FNP-BC Primary Care and Sports Medicine [facility]

## 2020-10-30 LAB — URINE CULTURE
MICRO NUMBER:: 11599127
MICRO NUMBER:: 11598963
Result:: NO GROWTH
Result:: NO GROWTH
SPECIMEN QUALITY:: ADEQUATE
SPECIMEN QUALITY:: ADEQUATE

## 2020-10-30 LAB — HEPATITIS C ANTIBODY
Hepatitis C Ab: NONREACTIVE
SIGNAL TO CUT-OFF: 0 (ref <1.00)

## 2020-10-30 LAB — C. TRACHOMATIS/N. GONORRHOEAE RNA
C. trachomatis RNA, TMA: NOT DETECTED
C. trachomatis RNA, TMA: NOT DETECTED
N. gonorrhoeae RNA, TMA: NOT DETECTED

## 2020-10-30 LAB — HIV ANTIBODY (ROUTINE TESTING W REFLEX): HIV 1&2 Ab, 4th Generation: NONREACTIVE

## 2020-10-30 LAB — RPR: RPR Ser Ql: NONREACTIVE

## 2020-10-30 LAB — HEPATITIS B SURFACE ANTIGEN: Hepatitis B Surface Ag: NONREACTIVE

## 2020-10-30 LAB — TRICHOMONAS VAGINALIS RNA, QL,MALES: Trichomonas vaginalis RNA: NOT DETECTED

## 2020-11-02 LAB — HEPATITIS C ANTIBODY
Hepatitis C Ab: NONREACTIVE
SIGNAL TO CUT-OFF: 0 (ref <1.00)

## 2020-11-02 LAB — C. TRACHOMATIS/N. GONORRHOEAE RNA: N. gonorrhoeae RNA, TMA: NOT DETECTED

## 2020-11-02 LAB — HIV ANTIBODY (ROUTINE TESTING W REFLEX)

## 2020-11-02 LAB — RPR

## 2020-11-02 LAB — HEPATITIS B SURFACE ANTIGEN: Hepatitis B Surface Ag: NONREACTIVE

## 2020-11-02 LAB — TRICHOMONAS VAGINALIS RNA, QL,MALES: Trichomonas vaginalis RNA: NOT DETECTED

## 2020-11-02 LAB — EXTRA URINE SPECIMEN

## 2020-11-03 ENCOUNTER — Other Ambulatory Visit: Payer: Self-pay | Admitting: Medical-Surgical

## 2021-09-07 ENCOUNTER — Other Ambulatory Visit: Payer: Self-pay

## 2021-09-07 ENCOUNTER — Ambulatory Visit (INDEPENDENT_AMBULATORY_CARE_PROVIDER_SITE_OTHER): Payer: PRIVATE HEALTH INSURANCE | Admitting: Medical-Surgical

## 2021-09-07 ENCOUNTER — Encounter: Payer: Self-pay | Admitting: Medical-Surgical

## 2021-09-07 VITALS — BP 111/77 | HR 109 | Resp 20 | Ht 75.0 in | Wt 178.0 lb

## 2021-09-07 DIAGNOSIS — R0989 Other specified symptoms and signs involving the circulatory and respiratory systems: Secondary | ICD-10-CM

## 2021-09-07 DIAGNOSIS — J40 Bronchitis, not specified as acute or chronic: Secondary | ICD-10-CM

## 2021-09-07 DIAGNOSIS — J329 Chronic sinusitis, unspecified: Secondary | ICD-10-CM

## 2021-09-07 LAB — POCT INFLUENZA A/B
Influenza A, POC: NEGATIVE
Influenza B, POC: NEGATIVE

## 2021-09-07 MED ORDER — PREDNISONE 50 MG PO TABS: 50.0000 mg | ORAL_TABLET | Freq: Every day | ORAL | 0 refills | Status: DC

## 2021-09-07 MED ORDER — HYDROCODONE BIT-HOMATROP MBR 5-1.5 MG/5ML PO SOLN: 5.0000 mL | Freq: Three times a day (TID) | ORAL | 0 refills | Status: DC | PRN

## 2021-09-07 MED ORDER — AMOXICILLIN-POT CLAVULANATE 875-125 MG PO TABS: 1.0000 | ORAL_TABLET | Freq: Two times a day (BID) | ORAL | 0 refills | Status: AC

## 2021-09-07 NOTE — Progress Notes (Signed)
° °  Established patient office visit  HPI with pertinent ROS:   CC: Respiratory illness  HPI: Pleasant 26 year old male presenting today for evaluation of respiratory illness.  He originally second in October with viral illness that was going around his house.  He has sinus congestion, cough, body aches, headache, and fever T-max 102.  His symptoms took a full 5 weeks to go away but then recurred near the end of November.  His symptoms were similar and resolved spontaneously on their own after several days.  In December, he notes that he again got sick and his symptoms have been bouncing on and off since then.  Today he reports with sore throat, cough productive of thick green mucus, nausea, diarrhea, mild shortness of breath, sinus congestion, facial pain and pressure on the right side, headache, and body aches.  He has been using Mucinex and Tylenol Cold intermittently but realized that overuse of these medications can be more of an issue.  He did go to urgent care at some point and was prescribed amoxicillin 1000 mg twice daily for symptoms.  His second trip to urgent care, they did not prescribe any medications however they did do a COVID test.  He did test at home for COVID with a negative result early on in his symptoms in October but has not tested since then.  I reviewed the past medical history, family history, social history, surgical history, and allergies today and no changes were needed.  Please see the problem list section below in epic for further details.   Brief exam, Assessment, and Plan:   Today's Vitals: BP 111/77 (BP Location: Left Arm, Patient Position: Sitting, Cuff Size: Normal)    Pulse (!) 109    Resp 20    Ht 6\' 3"  (1.905 m)    Wt 178 lb (80.7 kg)    SpO2 98%    BMI 22.25 kg/m   1. Respiratory symptoms POCT flu swab completed today.  Sending off COVID swab to the lab for PCR testing.  2. Sinobronchitis He does have quite a bit of congestion as well as a forceful  productive cough today.  Also notable for right maxillary sinus tenderness and mild submandibular lymphadenopathy.  Treating with Augmentin twice daily x10 days.  Adding prednisone 50 mg daily.  Hycodan cough syrup for use at night but try using over-the-counter Delsym during the day.  Recommend adding in Mucinex D to help with congestion during the day.  Make sure to get plenty of fluids as well as rest.  Okay to use over-the-counter cold and flu preparations if desired for symptom management.  Return if symptoms worsen or fail to improve. ___________________________________________ , DNP, APRN, FNP-BC Primary Care and Sports Medicine [facility]

## 2021-09-07 NOTE — Addendum Note (Signed)
Addended by: Gonzella Lex R on: 09/07/2021 02:17 PM   Modules accepted: Orders

## 2021-09-07 NOTE — Patient Instructions (Signed)
Recommend using Delsym cough syrup available over-the-counter during the day, okay to use Hycodan cough syrup at night.  This is a controlled substance so please avoid driving and do not use when having to go to work until you know how it affects you.  Avoid use of alcohol and other illicit substances if taking Hycodan cough syrup.  Okay to use Tylenol cold and flu or other cold and flu preparation over-the-counter.  Would recommend adding in Mucinex or Mucinex D to help thin secretions.  Increase hydration and work to get plenty of rest.  Start Augmentin twice daily for the next 10 days.  Start prednisone 50 mg daily for the next 5 days.

## 2021-09-08 LAB — SARS-COV-2, NAA 2 DAY TAT

## 2021-09-08 LAB — NOVEL CORONAVIRUS, NAA: SARS-CoV-2, NAA: DETECTED — AB

## 2021-11-03 ENCOUNTER — Ambulatory Visit (INDEPENDENT_AMBULATORY_CARE_PROVIDER_SITE_OTHER): Payer: PRIVATE HEALTH INSURANCE | Admitting: Medical-Surgical

## 2021-11-03 ENCOUNTER — Encounter: Payer: Self-pay | Admitting: Medical-Surgical

## 2021-11-03 ENCOUNTER — Other Ambulatory Visit: Payer: Self-pay

## 2021-11-03 VITALS — BP 124/79 | HR 98 | Resp 20 | Ht 75.0 in | Wt 186.0 lb

## 2021-11-03 DIAGNOSIS — R0989 Other specified symptoms and signs involving the circulatory and respiratory systems: Secondary | ICD-10-CM

## 2021-11-03 DIAGNOSIS — R000 Tachycardia, unspecified: Secondary | ICD-10-CM | POA: Diagnosis not present

## 2021-11-03 DIAGNOSIS — J329 Chronic sinusitis, unspecified: Secondary | ICD-10-CM

## 2021-11-03 DIAGNOSIS — R634 Abnormal weight loss: Secondary | ICD-10-CM | POA: Diagnosis not present

## 2021-11-03 MED ORDER — CETIRIZINE-PSEUDOEPHEDRINE ER 5-120 MG PO TB12: 1.0000 | ORAL_TABLET | Freq: Two times a day (BID) | ORAL | 0 refills | Status: DC

## 2021-11-03 MED ORDER — AZELASTINE HCL 0.1 % NA SOLN: 2.0000 | Freq: Two times a day (BID) | NASAL | 1 refills | Status: DC

## 2021-11-03 NOTE — Progress Notes (Signed)
?  HPI with pertinent ROS:  ? ?CC: respiratory symptoms ? ?HPI: ?Pleasant 26 year old male presenting today for recurrent respiratory symptoms that he reports started back in October when he had COVID. Feels that the symptoms have been off and on since then with periods of reprieve. Was treated for Sinobronchitis in January with a burst of prednisone and Augmentin which he reports resolved his symptoms completely for about a week and a half but the symptoms gradually came back after that. Notes having a sore throat with rhinorrhea and nasal congestion. Also has a cough which is productive at times. No fevers, chills, chest pain, shortness of breath, or GI symptoms.  ? ?I reviewed the past medical history, family history, social history, surgical history, and allergies today and no changes were needed.  Please see the problem list section below in epic for further details. ? ? ?Physical exam:  ? ?General: Well Developed, well nourished, and in no acute distress.  ?Neuro: Alert and oriented x3. Bilateral fine tremor noted to hands ?HEENT: Normocephalic, atraumatic, pupils equal round reactive to light, neck supple, no masses, no lymphadenopathy, thyroid nonpalpable. No sinus tenderness to palpation.  ?Skin: Warm and dry. ?Cardiac: Tachycardia and regular rhythm, no murmurs rubs or gallops, no lower extremity edema.  ?Respiratory: Clear to auscultation bilaterally. Not using accessory muscles, speaking in full sentences. ? ?Impression and Recommendations:   ? ?1. Respiratory symptoms ?2. Recurrent sinusitis ?Unclear etiology. Consider long COVID symptoms, allergic rhinitis, vs reinfection (close exposure to several roommates). Recommend Zyrtec-D BID x 10 days. Azelastine nasal spray BID prn. Checking CBC w/diff. Referring to Allergy for further investigation.  ?- Ambulatory referral to Allergy ?- CBC with Differential ? ?3. Tachycardia ?4. Weight loss, unintentional ?Incidental findings of tachycardia, unintentional  weight loss, and fine hand tremors. Checking thyroid function.  ?- TSH + free T4 ? ?Return if symptoms worsen or fail to improve. ?___________________________________________ ?Thayer Ohm, DNP, APRN, FNP-BC ?Primary Care and Sports Medicine ?[facility] ?

## 2021-11-04 LAB — CBC WITH DIFFERENTIAL/PLATELET
Absolute Monocytes: 980 cells/uL — ABNORMAL HIGH (ref 200–950)
Basophils Absolute: 87 cells/uL (ref 0–200)
Basophils Relative: 0.9 %
Eosinophils Absolute: 572 cells/uL — ABNORMAL HIGH (ref 15–500)
Eosinophils Relative: 5.9 %
HCT: 47.1 % (ref 38.5–50.0)
Hemoglobin: 16.1 g/dL (ref 13.2–17.1)
Lymphs Abs: 2726 cells/uL (ref 850–3900)
MCH: 31.4 pg (ref 27.0–33.0)
MCHC: 34.2 g/dL (ref 32.0–36.0)
MCV: 92 fL (ref 80.0–100.0)
MPV: 9.7 fL (ref 7.5–12.5)
Monocytes Relative: 10.1 %
Neutro Abs: 5335 cells/uL (ref 1500–7800)
Neutrophils Relative %: 55 %
Platelets: 300 10*3/uL (ref 140–400)
RBC: 5.12 10*6/uL (ref 4.20–5.80)
RDW: 12.4 % (ref 11.0–15.0)
Total Lymphocyte: 28.1 %
WBC: 9.7 10*3/uL (ref 3.8–10.8)

## 2021-11-04 LAB — TSH+FREE T4: TSH W/REFLEX TO FT4: 1.08 mIU/L (ref 0.40–4.50)

## 2021-11-17 ENCOUNTER — Encounter: Payer: Self-pay | Admitting: Medical-Surgical

## 2021-11-18 ENCOUNTER — Encounter: Payer: Self-pay | Admitting: Physician Assistant

## 2021-11-18 ENCOUNTER — Ambulatory Visit (INDEPENDENT_AMBULATORY_CARE_PROVIDER_SITE_OTHER): Payer: PRIVATE HEALTH INSURANCE | Admitting: Physician Assistant

## 2021-11-18 ENCOUNTER — Other Ambulatory Visit: Payer: Self-pay

## 2021-11-18 VITALS — BP 134/63 | HR 65 | Temp 98.4°F | Ht 75.0 in | Wt 185.0 lb

## 2021-11-18 DIAGNOSIS — N6322 Unspecified lump in the left breast, upper inner quadrant: Secondary | ICD-10-CM

## 2021-11-18 DIAGNOSIS — R634 Abnormal weight loss: Secondary | ICD-10-CM

## 2021-11-18 DIAGNOSIS — Z809 Family history of malignant neoplasm, unspecified: Secondary | ICD-10-CM | POA: Insufficient documentation

## 2021-11-18 DIAGNOSIS — Z803 Family history of malignant neoplasm of breast: Secondary | ICD-10-CM | POA: Diagnosis not present

## 2021-11-18 NOTE — Progress Notes (Signed)
? ?  Subjective:  ? ? Patient ID: Shannon Barber, male    DOB: 1996-08-28, 28 y.o.   MRN: [id number] ? ?HPI ?Pt is a 26 yo male who noticed a left breast lump around his nipple for the last 2 days. The lump is a little tender to touch but not red or warm. No nipple discharge or retraction. He is concerned because of his family history of breast cancer with mother and grandmother as well as his unintentional weight loss over the past year of per patient 14lbs. Denies any fever, sweats, chills, nausea, vomiting.  ? ? ?.. ?Active Ambulatory Problems  ?  Diagnosis Date Noted  ? Excessive sexual desire in women 03/29/2016  ? Insomnia 06/10/2016  ? Right foot pain 12/25/2018  ? Mass of upper inner quadrant of left breast 11/18/2021  ? ?Resolved Ambulatory Problems  ?  Diagnosis Date Noted  ? Excessive sexual desire in men 10/16/2015  ? ?Past Medical History:  ?Diagnosis Date  ? Allergy   ? Atopic dermatitis   ? Bronchitis   ? Keratosis pilaris 2006  ? ? ?Review of Systems ?See HPI.  ?   ?Objective:  ? Physical Exam ?Chest:  ? ? ? ? ? ? ? ?   ?Assessment & Plan:  ?..Shannon Barber was seen today for breast mass. ? ?Diagnoses and all orders for this visit: ? ?Mass of upper inner quadrant of left breast ? ?Weight loss, unintentional ? ?Family history of breast cancer ? ? ?Will get imaging.  ?Likely sebaceous cyst or another benign etiology however with patients history needs more evaluation.  ?Follow up as needed or if symptoms change or worsen.  ? ?

## 2021-11-18 NOTE — Patient Instructions (Signed)
Will get breast ultrasound.  ?

## 2021-11-23 ENCOUNTER — Other Ambulatory Visit: Payer: Self-pay | Admitting: Physician Assistant

## 2021-11-23 DIAGNOSIS — N6322 Unspecified lump in the left breast, upper inner quadrant: Secondary | ICD-10-CM

## 2021-11-23 DIAGNOSIS — Z803 Family history of malignant neoplasm of breast: Secondary | ICD-10-CM

## 2021-11-23 DIAGNOSIS — R634 Abnormal weight loss: Secondary | ICD-10-CM

## 2021-11-26 ENCOUNTER — Other Ambulatory Visit: Payer: Self-pay | Admitting: Medical-Surgical

## 2021-12-01 ENCOUNTER — Ambulatory Visit: Payer: PRIVATE HEALTH INSURANCE

## 2021-12-01 ENCOUNTER — Ambulatory Visit
Admission: RE | Admit: 2021-12-01 | Discharge: 2021-12-01 | Disposition: A | Discharge status: Home / Self Care | Payer: PRIVATE HEALTH INSURANCE | Source: Ambulatory Visit | Attending: Physician Assistant | Admitting: Physician Assistant

## 2021-12-01 DIAGNOSIS — N6322 Unspecified lump in the left breast, upper inner quadrant: Secondary | ICD-10-CM

## 2021-12-01 DIAGNOSIS — R634 Abnormal weight loss: Secondary | ICD-10-CM

## 2021-12-01 DIAGNOSIS — Z803 Family history of malignant neoplasm of breast: Secondary | ICD-10-CM

## 2021-12-01 NOTE — Progress Notes (Signed)
GREAT news. Male breast tissue detected. No abnormal findings. No follow up needed.

## 2021-12-08 ENCOUNTER — Ambulatory Visit: Payer: Self-pay | Admitting: Internal Medicine

## 2021-12-08 ENCOUNTER — Other Ambulatory Visit: Payer: PRIVATE HEALTH INSURANCE

## 2022-01-12 ENCOUNTER — Encounter: Payer: Self-pay | Admitting: Internal Medicine

## 2022-01-12 ENCOUNTER — Ambulatory Visit (INDEPENDENT_AMBULATORY_CARE_PROVIDER_SITE_OTHER): Payer: PRIVATE HEALTH INSURANCE | Admitting: Internal Medicine

## 2022-01-12 VITALS — BP 110/72 | HR 91 | Temp 98.7°F | Resp 16 | Ht 74.5 in | Wt 182.8 lb

## 2022-01-12 DIAGNOSIS — K219 Gastro-esophageal reflux disease without esophagitis: Secondary | ICD-10-CM | POA: Insufficient documentation

## 2022-01-12 DIAGNOSIS — Z720 Tobacco use: Secondary | ICD-10-CM | POA: Diagnosis not present

## 2022-01-12 DIAGNOSIS — J3089 Other allergic rhinitis: Secondary | ICD-10-CM | POA: Diagnosis not present

## 2022-01-12 DIAGNOSIS — R053 Chronic cough: Secondary | ICD-10-CM

## 2022-01-12 MED ORDER — CETIRIZINE HCL 10 MG PO TABS: 10.0000 mg | ORAL_TABLET | Freq: Every day | ORAL | 3 refills | Status: AC

## 2022-01-12 MED ORDER — FLUTICASONE PROPIONATE 50 MCG/ACT NA SUSP: 2.0000 | Freq: Every day | NASAL | 2 refills | Status: DC

## 2022-01-12 MED ORDER — MONTELUKAST SODIUM 10 MG PO TABS: 10.0000 mg | ORAL_TABLET | Freq: Every day | ORAL | 3 refills | Status: AC

## 2022-01-12 MED ORDER — FLUTICASONE PROPIONATE HFA 110 MCG/ACT IN AERO: 1.0000 | INHALATION_SPRAY | Freq: Two times a day (BID) | RESPIRATORY_TRACT | 12 refills | Status: DC

## 2022-01-12 NOTE — Progress Notes (Signed)
? ?New Patient Note ? ?RE: Shannon Barber MRN: [id number] DOB: 07/13/1996 ?Date of Office Visit: 01/12/2022 ? ?Consult requested by: Christen Butter, NP ?Primary care provider: Christen Butter, NP ? ?Chief Complaint: Cough (/) ? ?History of Present Illness: ?I had the pleasure of seeing Shannon Barber for initial evaluation at the [facility] on 01/12/2022. He is a 26 y.o. male, who is referred here by Christen Butter, NP for the evaluation of chronic rhinitis, chronic cough. ? ?Cough: Initially started after COVID infection in 05/2022, now occurring daily.  ?Associates:  also with 35 lb unintentional weight loss (normal thyroid tests)  chest tightness, cough, shortness of breath, wheezing, post nasal drainage, reflux ?Trial of OCS: yes - some improvement Trial of Inhalers: No, previously on albuterol with PNA 9 years ago  ?History of Reflux: Yes ?History of post nasal drainage: YES ?Triggers: exercise,, respiratory illness,, and smoke exposure, ?Therapies tried: astelin (helped nasal congestion, not cough)  ?Taking an ACE-I or ARB:  ? ?Up-to-date with pneumonia Covid-19 vaccines. ?History of prior pneumonias: 2016  ?History of prior COVID-19 infection: 3 times, 2020, 2021, 2022 ?Smoking history/exposure: 7 pack yeas, quit in 2019, current vaper but trying to quit  ? ?Chronic rhinitis: started OCT 2023  ?Symptoms include: nasal congestion, rhinorrhea, and post nasal drainage He was treated with sinobronchitis in January with prednisone and Augmentin.  ?Occurs  episodic, always with some persistent symptoms  ?Potential triggers: recurrent URI, vaping  ?Treatments tried: astelin  ?Previous allergy testing: no ?History of reflux/heartburn: yes ?History of chronic sinusitis or sinus surgery: no ?Nonallergic triggers: smoke  ? ? ?Assessment and Plan: ?[name] is a 26 y.o. male with: ?Chronic cough - Plan: Spirometry with Graph, Interdermal Allergy Test ? ?Other allergic rhinitis - Plan: Spirometry with Graph,  Allergy Test, Interdermal Allergy Test ? ?Tobacco use ? ?Gastroesophageal reflux disease without esophagitis ?Plan: ?Patient Instructions  ?Etiology of chronic cough is broad. Common considerations include asthma, COPD, allergic rhinitis, nonallergic rhinitis, infections, reflux (GERD/LPR), neurogenic and/or habitual cough.  Mainstay of treatment is to control all possible triggers and address the cough hypersensitivity aspect.  ? ?The history and physical examination suggest this cough is multifactorial and potentially attributed to  rhinitis, uncontrolled asthma, and GERD.  ?We will address  rhinitis , GERD, cigarette exposure, and asthma""COPD at this time.  ? ?PLAN:  ?Spirometry was normal at this time.  This does not rule out a trial of inhaled corticosteroids. ?Start Flovent 110mg  1 puff twice daily with spacer, spacer education given today  ?Singulair 10mg  daily.  Counseled on potential side effects. ? ?Seasonal and Perennial  Rhinitis: not well controlled  ?- Testing today showed Skin prick positive to Trees, weed, mold Dust Mite and ,  ?Intradermals were all negative  ?- Copy of test results provided.  ?- Avoidance measures provided. ?- Continue with: Astelin (azelastine) 2 sprays per nostril 1-2 times daily as needed ?- Start taking: Zyrtec (cetirizine) 10mg  tablet once daily, Singulair (montelukast) 10mg  daily, and Flonase (fluticasone) two sprays per nostril daily ?- You can use an extra dose of the antihistamine, if needed, for breakthrough symptoms.  ?- Consider nasal saline rinses 1-2 times daily to remove allergens from the nasal cavities as well as help with mucous clearance (this is especially helpful to do before the nasal sprays are given) ?- Consider allergy shots as a means of long-term control and can reduce lifetime use of medications  ?- Allergy shots "re-train" and "reset" the immune system  to ignore environmental allergens and decrease the resulting immune response to those allergens  (sneezing, itchy watery eyes, runny nose, nasal congestion, etc).    ?- Allergy shots improve symptoms in 75-85%  ?- Allergy shots are the only potential permanent and disease modifying option  ?- We can discuss more at the next appointment if the medications are not working for you. ? ? GERD  ?- Continue dietary and lifestyle modifications  ?-If this does not work we can consider starting acid suppression medication.   ? ?Follow up: 2 months  ? ?Thank you so much for letting me partake in your care today.  Don't hesitate to reach out if you have any additional concerns! ? ?Ferol Luz, MD  ?[facility] [place] [place] ?----------------------------------------------------------------------------------------------------------------------------------------------------------- ?Reducing Pollen Exposure ? ?The American Academy of Allergy, Asthma and Immunology suggests the following steps to reduce your exposure to pollen during allergy seasons. ?   ?Do not hang sheets or clothing out to dry; pollen may collect on these items. ?Do not mow lawns or spend time around freshly cut grass; mowing stirs up pollen. ?Keep windows closed at night.  Keep car windows closed while driving. ?Minimize morning activities outdoors, a time when pollen counts are usually at their highest. ?Stay indoors as much as possible when pollen counts or humidity is high and on windy days when pollen tends to remain in the air longer. ?Use air conditioning when possible.  Many air conditioners have filters that trap the pollen spores. ?Use a HEPA room air filter to remove pollen form the indoor air you breathe.  ?DUST MITE AVOIDANCE MEASURES: ? ?There are three main measures that need and can be taken to avoid house dust mites: ? ?Reduce accumulation of dust in general ?-reduce furniture, clothing, carpeting, books, stuffed animals, especially in bedroom ? ?Separate yourself from the dust ?-use pillow and mattress encasements (can  be found at stores such as [employer] or online) ?-avoid direct exposure to air condition flow ?-use a HEPA filter device, especially in the bedroom; you can also use a HEPA filter vacuum cleaner ?-wipe dust with a moist towel instead of a dry towel or broom when cleaning ? ?Decrease mites and/or their secretions ?-wash clothing and linen and stuffed animals at highest temperature possible, at least every 2 weeks ?-stuffed animals can also be placed in a bag and put in a freezer overnight ? ?Despite the above measures, it is impossible to eliminate dust mites or their allergen completely from your home.  With the above measures the burden of mites in your home can be diminished, with the goal of minimizing your allergic symptoms.  Success will be reached only when implementing and using all means together.  ? ?Control of Mold Allergen  ? ?Mold and fungi can grow on a variety of surfaces provided certain temperature and moisture conditions exist.  Outdoor molds grow on plants, decaying vegetation and soil.  The major outdoor mold, Alternaria and Cladosporium, are found in very high numbers during hot and dry conditions.  Generally, a late Summer - Fall peak is seen for common outdoor fungal spores.  Rain will temporarily lower outdoor mold spore count, but counts rise rapidly when the rainy period ends.  The most important indoor molds are Aspergillus and Penicillium.  Dark, humid and poorly ventilated basements are ideal sites for mold growth.  The next most common sites of mold growth are the bathroom and the kitchen. ? ?Outdoor (Seasonal) Mold Control ? ?  Positive outdoor molds via skin testing: Bipolaris (Helminthsporium), Mucor, and Epicoccum ? ?Use air conditioning and keep windows closed ?Avoid exposure to decaying vegetation. ?Avoid leaf raking. ?Avoid grain handling. ?Consider wearing a face mask if working in moldy areas.  ? ? ?Indoor (Perennial) Mold Control  ? ?Positive indoor molds via skin  testing: Aspergillus, Penicillium, Fusarium, and Botrytis ? ?Maintain humidity below 50%. ?Clean washable surfaces with 5% bleach solution. ?Remove sources e.g. contaminated carpets. ? ?  ? ?Return in about 2

## 2022-01-12 NOTE — Patient Instructions (Addendum)
Etiology of chronic cough is broad. Common considerations include asthma, COPD, allergic rhinitis, nonallergic rhinitis, infections, reflux (GERD/LPR), neurogenic and/or habitual cough.  Mainstay of treatment is to control all possible triggers and address the cough hypersensitivity aspect.  ? ?The history and physical examination suggest this cough is multifactorial and potentially attributed to  rhinitis, uncontrolled asthma, and GERD.  ?We will address  rhinitis , GERD, cigarette exposure, and asthma""COPD at this time.  ? ?PLAN:  ?Spirometry was normal at this time.  This does not rule out a trial of inhaled corticosteroids. ?Start Flovent 110mg  1 puff twice daily with spacer, spacer education given today  ?Singulair 10mg  daily.  Counseled on potential side effects. ? ?Seasonal and Perennial  Rhinitis: not well controlled  ?- Testing today showed Skin prick positive to Trees, weed, mold Dust Mite and ,  ?Intradermals were all negative  ?- Copy of test results provided.  ?- Avoidance measures provided. ?- Continue with: Astelin (azelastine) 2 sprays per nostril 1-2 times daily as needed ?- Start taking: Zyrtec (cetirizine) 10mg  tablet once daily, Singulair (montelukast) 10mg  daily, and Flonase (fluticasone) two sprays per nostril daily ?- You can use an extra dose of the antihistamine, if needed, for breakthrough symptoms.  ?- Consider nasal saline rinses 1-2 times daily to remove allergens from the nasal cavities as well as help with mucous clearance (this is especially helpful to do before the nasal sprays are given) ?- Consider allergy shots as a means of long-term control and can reduce lifetime use of medications  ?- Allergy shots "re-train" and "reset" the immune system to ignore environmental allergens and decrease the resulting immune response to those allergens (sneezing, itchy watery eyes, runny nose, nasal congestion, etc).    ?- Allergy shots improve symptoms in 75-85%  ?- Allergy shots are the  only potential permanent and disease modifying option  ?- We can discuss more at the next appointment if the medications are not working for you. ? ? GERD  ?- Continue dietary and lifestyle modifications  ?-If this does not work we can consider starting acid suppression medication.   ? ?Follow up: 2 months  ? ?Thank you so much for letting me partake in your care today.  Don't hesitate to reach out if you have any additional concerns! ? ?Carolin Coy, MD  ?[facility] [place], [place] ?----------------------------------------------------------------------------------------------------------------------------------------------------------- ?Reducing Pollen Exposure ? ?The American Academy of Allergy, Asthma and Immunology suggests the following steps to reduce your exposure to pollen during allergy seasons. ?   ?Do not hang sheets or clothing out to dry; pollen may collect on these items. ?Do not mow lawns or spend time around freshly cut grass; mowing stirs up pollen. ?Keep windows closed at night.  Keep car windows closed while driving. ?Minimize morning activities outdoors, a time when pollen counts are usually at their highest. ?Stay indoors as much as possible when pollen counts or humidity is high and on windy days when pollen tends to remain in the air longer. ?Use air conditioning when possible.  Many air conditioners have filters that trap the pollen spores. ?Use a HEPA room air filter to remove pollen form the indoor air you breathe.  ?DUST MITE AVOIDANCE MEASURES: ? ?There are three main measures that need and can be taken to avoid house dust mites: ? ?Reduce accumulation of dust in general ?-reduce furniture, clothing, carpeting, books, stuffed animals, especially in bedroom ? ?Separate yourself from the dust ?-use pillow and mattress encasements (can be found  at stores such as [employer] or online) ?-avoid direct exposure to air condition flow ?-use a HEPA filter device,  especially in the bedroom; you can also use a HEPA filter vacuum cleaner ?-wipe dust with a moist towel instead of a dry towel or broom when cleaning ? ?Decrease mites and/or their secretions ?-wash clothing and linen and stuffed animals at highest temperature possible, at least every 2 weeks ?-stuffed animals can also be placed in a bag and put in a freezer overnight ? ?Despite the above measures, it is impossible to eliminate dust mites or their allergen completely from your home.  With the above measures the burden of mites in your home can be diminished, with the goal of minimizing your allergic symptoms.  Success will be reached only when implementing and using all means together.  ? ?Control of Mold Allergen  ? ?Mold and fungi can grow on a variety of surfaces provided certain temperature and moisture conditions exist.  Outdoor molds grow on plants, decaying vegetation and soil.  The major outdoor mold, Alternaria and Cladosporium, are found in very high numbers during hot and dry conditions.  Generally, a late Summer - Fall peak is seen for common outdoor fungal spores.  Rain will temporarily lower outdoor mold spore count, but counts rise rapidly when the rainy period ends.  The most important indoor molds are Aspergillus and Penicillium.  Dark, humid and poorly ventilated basements are ideal sites for mold growth.  The next most common sites of mold growth are the bathroom and the kitchen. ? ?Outdoor (Seasonal) Mold Control ? ?Positive outdoor molds via skin testing: Bipolaris (Helminthsporium), Mucor, and Epicoccum ? ?Use air conditioning and keep windows closed ?Avoid exposure to decaying vegetation. ?Avoid leaf raking. ?Avoid grain handling. ?Consider wearing a face mask if working in moldy areas.  ? ? ?Indoor (Perennial) Mold Control  ? ?Positive indoor molds via skin testing: Aspergillus, Penicillium, Fusarium, and Botrytis ? ?Maintain humidity below 50%. ?Clean washable surfaces with 5% bleach  solution. ?Remove sources e.g. contaminated carpets. ? ?  ? ?

## 2022-01-14 ENCOUNTER — Other Ambulatory Visit: Payer: Self-pay

## 2022-01-14 MED ORDER — FLOVENT HFA 110 MCG/ACT IN AERO
2.0000 | INHALATION_SPRAY | Freq: Two times a day (BID) | RESPIRATORY_TRACT | 5 refills | Status: DC
Start: 2022-01-14 — End: 2024-05-11

## 2022-03-15 ENCOUNTER — Ambulatory Visit: Payer: PRIVATE HEALTH INSURANCE | Admitting: Internal Medicine

## 2024-01-18 IMAGING — MG DIGITAL DIAGNOSTIC BILAT W/ TOMO W/ CAD
6 of 10 series · 6 of 30 positions shown · non-contrast
Comparison: Previous exam(s).

CLINICAL DATA: Pea-sized palpable lump in the left retroareolar
region.

EXAM:
DIGITAL DIAGNOSTIC BILATERAL MAMMOGRAM WITH TOMOSYNTHESIS AND CAD
TECHNIQUE: Bilateral digital diagnostic mammography and breast tomosynthesis
was performed. The images were evaluated with computer-aided
detection.

[L MLO synth-2D (1 of 2)]
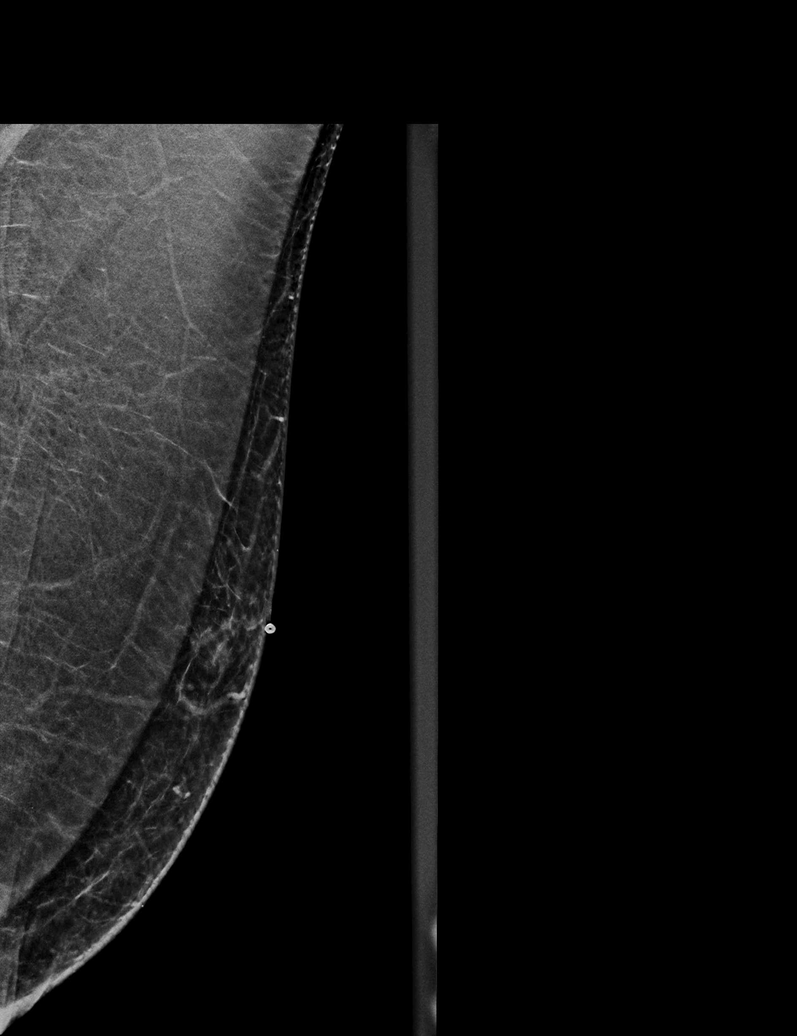

[R MLO synth-2D]
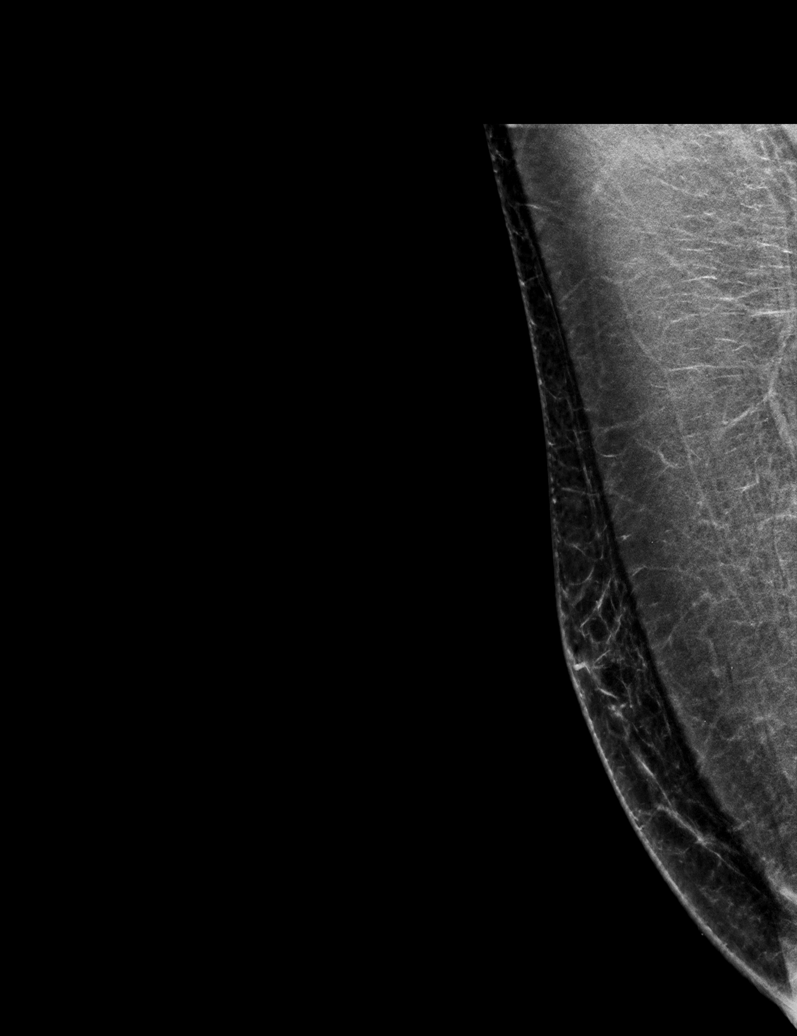

[L MLO synth-2D (2 of 2)]
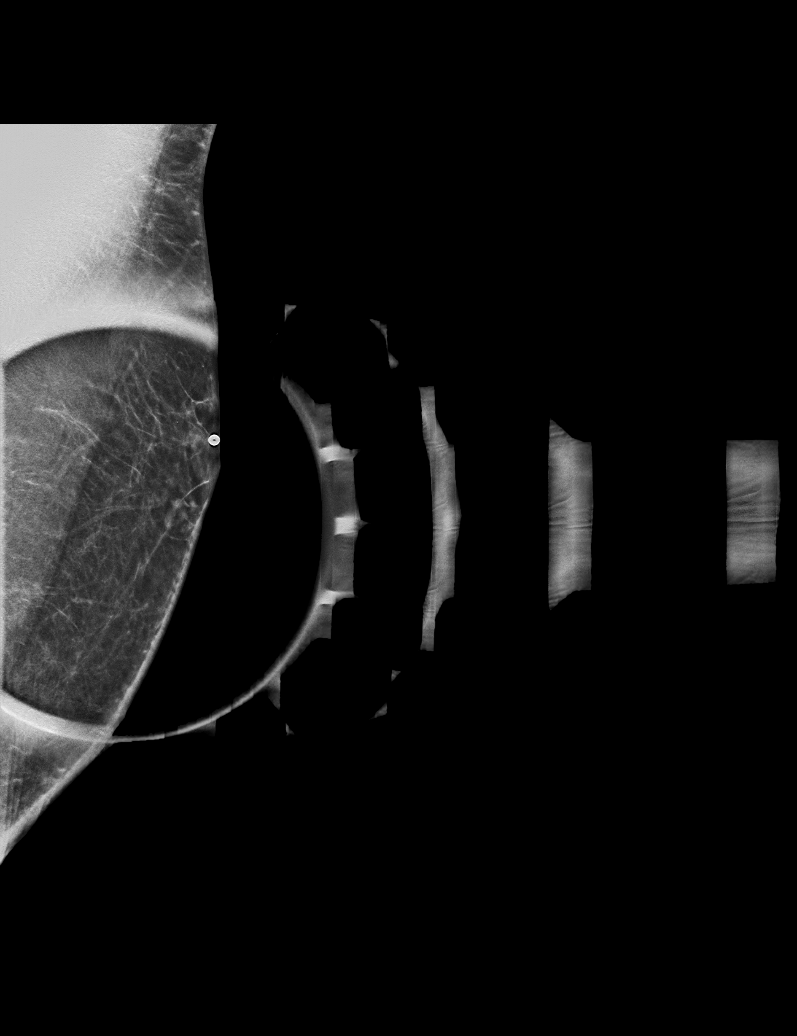

[L CC synth-2D]
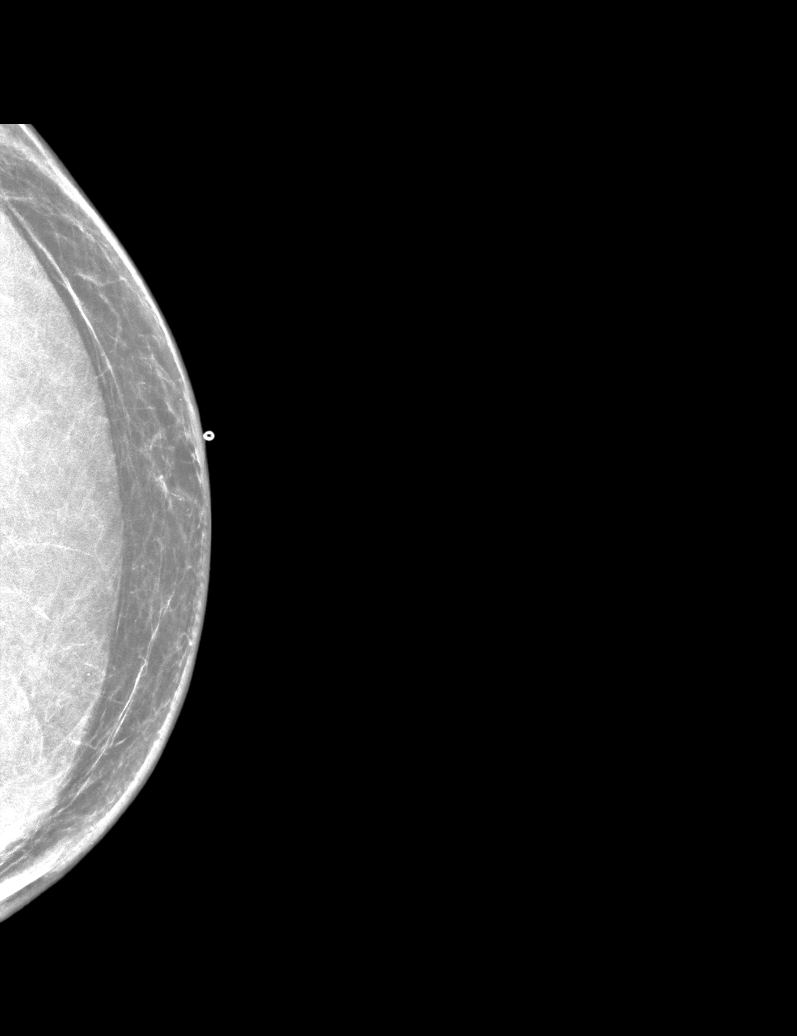

[R CC synth-2D]
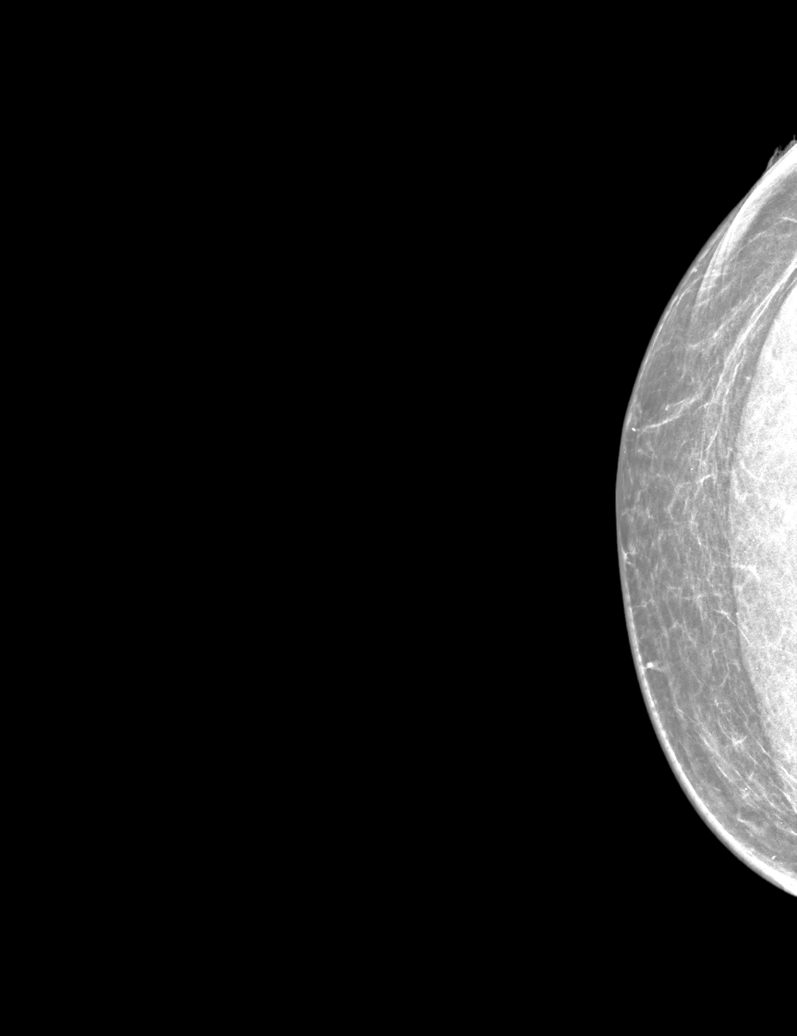

[L MLO tomo · tomo slice 23/46.0]
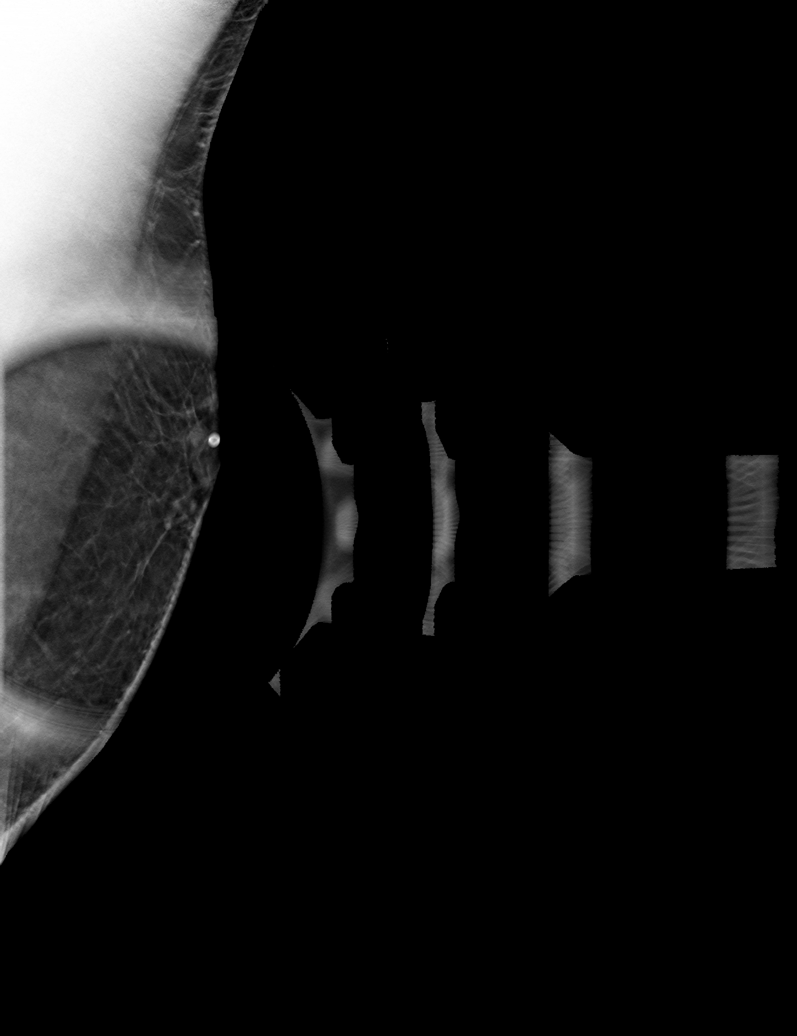

[6 of 30 positions shown; findings below may reference images not displayed]

ACR Breast Density Category b: There are scattered areas of
fibroglandular density.
FINDINGS: There may be trace gynecomastia in the left retroareolar region. No
other suspicious findings identified in either breast.
IMPRESSION: No evidence of malignancy. Trace gynecomastia in the left
retroareolar region, likely accounting for the patient's symptoms.

RECOMMENDATION:
Recommend clinical follow-up. No imaging follow-up necessary unless
there are clinical changes.

I have discussed the findings and recommendations with the patient.
If applicable, a reminder letter will be sent to the patient
regarding the next appointment.

BI-RADS CATEGORY  2: Benign.

## 2024-05-11 ENCOUNTER — Ambulatory Visit (INDEPENDENT_AMBULATORY_CARE_PROVIDER_SITE_OTHER): Admitting: Medical-Surgical

## 2024-05-11 VITALS — BP 107/63 | HR 74 | Resp 20 | Ht 74.5 in | Wt 194.0 lb

## 2024-05-11 DIAGNOSIS — Z809 Family history of malignant neoplasm, unspecified: Secondary | ICD-10-CM

## 2024-05-11 DIAGNOSIS — K582 Mixed irritable bowel syndrome: Secondary | ICD-10-CM

## 2024-05-11 DIAGNOSIS — F5104 Psychophysiologic insomnia: Secondary | ICD-10-CM

## 2024-05-11 DIAGNOSIS — R5383 Other fatigue: Secondary | ICD-10-CM

## 2024-05-11 DIAGNOSIS — T592X1D Toxic effect of formaldehyde, accidental (unintentional), subsequent encounter: Secondary | ICD-10-CM | POA: Diagnosis not present

## 2024-05-11 NOTE — Progress Notes (Unsigned)
 Established patient visit   History of Present Illness   Discussed the use of AI scribe software for clinical note transcription with the patient, who gave verbal consent to proceed.  History of Present Illness   Shannon Barber is a 28 year old male who presents with fatigue and concerns about cancer risk.  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, perceived as increasing cancer risk - Recent death of a friend from cancer has heightened concern about personal cancer risk  Sleep disturbance - Chronic insomnia described as cyclical and present since childhood  Gastrointestinal symptoms - Irritable bowel syndrome with variable symptoms of constipation and diarrhea - No significant heartburn - No urinary issues  Lifestyle and substance use - 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  Constitutional:      General: He is not in acute distress.    Appearance: Normal appearance. He is not ill-appearing.  HENT:     Head: Normocephalic and atraumatic.  Cardiovascular:     Rate and Rhythm: Normal rate and regular rhythm.     Pulses: Normal pulses.     Heart sounds: Normal heart sounds.  No murmur heard.    No friction rub. No gallop.  Pulmonary:     Effort: Pulmonary effort is normal. No respiratory distress.     Breath sounds: Normal breath sounds.  Skin:    General: Skin is warm and dry.  Neurological:     Mental Status: He is alert and oriented to person, place, and time.  Psychiatric:        Mood and Affect: Mood normal.        Behavior: Behavior normal.        Thought Content: Thought content normal.        Judgment: Judgment normal.    Assessment & Plan   Assessment and Plan    Fatigue and chronic insomnia Fatigue for 2-3 weeks with non-restorative sleep. Low risk for sleep apnea. Chronic insomnia since childhood. Differential includes metabolic causes, vitamin deficiencies, and hormonal imbalances. - Order blood work: CBC, metabolic panel, iron studies, vitamin B12, vitamin D , testosterone  levels.  Irritable bowel syndrome with both constipation and diarrhea Long-standing IBS with variable symptoms of constipation and diarrhea. No recent exacerbations. - Recommend dietary modification to eliminate food triggers. - Consider a daily stool softener vs MiraLAX prn.  Formaldehyde poisoning, accidental or unintentional, subsequent encounter, Family history of cancer Increased cancer risk due to family history and past formaldehyde exposure. Discussed potential long-term effects of formaldehyde exposure. - Order CEA-125 blood test to assess cancer risk.       Follow up   Return if symptoms worsen or fail to improve. __________________________________ Zada FREDRIK Palin, DNP, APRN, FNP-BC [facility]

## 2024-05-12 ENCOUNTER — Encounter: Payer: Self-pay | Admitting: Medical-Surgical

## 2024-05-12 ENCOUNTER — Ambulatory Visit: Payer: Self-pay | Admitting: Medical-Surgical

## 2024-05-12 DIAGNOSIS — K582 Mixed irritable bowel syndrome: Secondary | ICD-10-CM | POA: Insufficient documentation

## 2024-05-12 DIAGNOSIS — E559 Vitamin D deficiency, unspecified: Secondary | ICD-10-CM

## 2024-05-12 LAB — CA 125: Cancer Antigen (CA) 125: 7 U/mL

## 2024-05-12 LAB — CMP14+EGFR
ALT: 10 IU/L (ref 0–44)
AST: 17 IU/L (ref 0–40)
Albumin: 4.9 g/dL (ref 4.3–5.2)
Alkaline Phosphatase: 59 IU/L (ref 44–121)
BUN/Creatinine Ratio: 13 (ref 9–20)
BUN: 12 mg/dL (ref 6–20)
Bilirubin Total: 0.5 mg/dL (ref 0.0–1.2)
CO2: 24 mmol/L (ref 20–29)
Calcium: 9.6 mg/dL (ref 8.7–10.2)
Chloride: 103 mmol/L (ref 96–106)
Creatinine, Ser: 0.94 mg/dL (ref 0.76–1.27)
Globulin, Total: 2.3 g/dL (ref 1.5–4.5)
Glucose: 76 mg/dL (ref 70–99)
Potassium: 4.1 mmol/L (ref 3.5–5.2)
Sodium: 142 mmol/L (ref 134–144)
Total Protein: 7.2 g/dL (ref 6.0–8.5)
eGFR: 112 mL/min/1.73 (ref >59)

## 2024-05-12 LAB — IRON,TIBC AND FERRITIN PANEL
Ferritin: 239 ng/mL (ref 30–400)
Iron Saturation: 37 % (ref 15–55)
Iron: 111 ug/dL (ref 38–169)
Total Iron Binding Capacity: 303 ug/dL (ref 250–450)
UIBC: 192 ug/dL (ref 111–343)

## 2024-05-12 LAB — CBC WITH DIFFERENTIAL/PLATELET
Basophils Absolute: 0.1 x10E3/uL (ref 0.0–0.2)
Basos: 1 %
EOS (ABSOLUTE): 0.2 x10E3/uL (ref 0.0–0.4)
Eos: 3 %
Hematocrit: 46 % (ref 37.5–51.0)
Hemoglobin: 15.6 g/dL (ref 13.0–17.7)
Immature Grans (Abs): 0 x10E3/uL (ref 0.0–0.1)
Immature Granulocytes: 0 %
Lymphocytes Absolute: 2.2 x10E3/uL (ref 0.7–3.1)
Lymphs: 29 %
MCH: 31.6 pg (ref 26.6–33.0)
MCHC: 33.9 g/dL (ref 31.5–35.7)
MCV: 93 fL (ref 79–97)
Monocytes Absolute: 0.6 x10E3/uL (ref 0.1–0.9)
Monocytes: 8 %
Neutrophils Absolute: 4.5 x10E3/uL (ref 1.4–7.0)
Neutrophils: 59 %
Platelets: 295 x10E3/uL (ref 150–450)
RBC: 4.94 x10E6/uL (ref 4.14–5.80)
RDW: 12.2 % (ref 11.6–15.4)
WBC: 7.6 x10E3/uL (ref 3.4–10.8)

## 2024-05-12 LAB — HEMOGLOBIN A1C
Est. average glucose Bld gHb Est-mCnc: 100 mg/dL
Hgb A1c MFr Bld: 5.1 % (ref 4.8–5.6)

## 2024-05-12 LAB — TESTOSTERONE: Testosterone: 371 ng/dL (ref 264–916)

## 2024-05-12 LAB — VITAMIN B12: Vitamin B-12: 287 pg/mL (ref 232–1245)

## 2024-05-12 LAB — TSH: TSH: 0.706 u[IU]/mL (ref 0.450–4.500)

## 2024-05-12 LAB — T4, FREE: Free T4: 1.3 ng/dL (ref 0.82–1.77)

## 2024-05-12 LAB — VITAMIN D 25 HYDROXY (VIT D DEFICIENCY, FRACTURES): Vit D, 25-Hydroxy: 13.9 ng/mL — ABNORMAL LOW (ref 30.0–100.0)

## 2024-05-12 MED ORDER — VITAMIN D (ERGOCALCIFEROL) 1.25 MG (50000 UNIT) PO CAPS: 50000.0000 [IU] | ORAL_CAPSULE | ORAL | 0 refills | Status: AC

## 2024-07-28 ENCOUNTER — Other Ambulatory Visit: Payer: Self-pay | Admitting: Medical-Surgical
